# Patient Record
Sex: Male | Born: 1955 | Race: White | Hispanic: No | Marital: Married
Health system: Midwestern US, Academic
[De-identification: ages and names within clinical notes are randomized; demographics above are authoritative.]

---

## 2018-05-13 ENCOUNTER — Encounter: Admit: 2018-05-13 | Discharge: 2018-05-14 | Payer: MEDICARE

## 2018-05-23 ENCOUNTER — Encounter: Admit: 2018-05-23 | Discharge: 2018-05-23 | Payer: MEDICARE

## 2018-05-23 ENCOUNTER — Encounter: Admit: 2018-05-23 | Discharge: 2018-05-24 | Payer: MEDICARE

## 2018-10-10 ENCOUNTER — Encounter: Admit: 2018-10-10 | Discharge: 2018-10-11 | Primary: Family

## 2018-10-28 ENCOUNTER — Encounter: Admit: 2018-10-28 | Discharge: 2018-10-28 | Payer: MEDICARE | Primary: Family

## 2018-10-28 DIAGNOSIS — R69 Illness, unspecified: Secondary | ICD-10-CM

## 2018-10-30 ENCOUNTER — Encounter: Admit: 2018-10-30 | Discharge: 2018-10-30 | Primary: Family

## 2018-10-30 DIAGNOSIS — M545 Low back pain: Secondary | ICD-10-CM

## 2018-11-01 ENCOUNTER — Encounter: Admit: 2018-11-01 | Discharge: 2018-11-01 | Primary: Family

## 2018-11-01 DIAGNOSIS — F329 Major depressive disorder, single episode, unspecified: Secondary | ICD-10-CM

## 2018-11-01 DIAGNOSIS — M810 Age-related osteoporosis without current pathological fracture: Secondary | ICD-10-CM

## 2018-11-01 DIAGNOSIS — E7800 Pure hypercholesterolemia, unspecified: Secondary | ICD-10-CM

## 2018-11-01 DIAGNOSIS — I890 Lymphedema, not elsewhere classified: Secondary | ICD-10-CM

## 2018-11-01 DIAGNOSIS — N529 Male erectile dysfunction, unspecified: Secondary | ICD-10-CM

## 2018-11-07 ENCOUNTER — Encounter: Admit: 2018-11-07 | Discharge: 2018-11-07 | Primary: Family

## 2018-11-07 ENCOUNTER — Ambulatory Visit: Admit: 2018-11-07 | Discharge: 2018-11-07 | Primary: Family

## 2018-11-07 DIAGNOSIS — M810 Age-related osteoporosis without current pathological fracture: Secondary | ICD-10-CM

## 2018-11-07 DIAGNOSIS — I890 Lymphedema, not elsewhere classified: Secondary | ICD-10-CM

## 2018-11-07 DIAGNOSIS — F329 Major depressive disorder, single episode, unspecified: Secondary | ICD-10-CM

## 2018-11-07 DIAGNOSIS — E7800 Pure hypercholesterolemia, unspecified: Secondary | ICD-10-CM

## 2018-11-07 DIAGNOSIS — N529 Male erectile dysfunction, unspecified: Secondary | ICD-10-CM

## 2018-11-07 DIAGNOSIS — M545 Low back pain: Secondary | ICD-10-CM

## 2018-11-08 ENCOUNTER — Encounter: Admit: 2018-11-08 | Discharge: 2018-11-08 | Primary: Family

## 2018-11-08 NOTE — Telephone Encounter
Pt calls to ask questions related to appointment yesterday. Wants to know about treating osteoporosis. Endocrine consult was placed, however pt believes he can get it treated closer to home. Meeting with PCP on Monday. He also is in the process of trying to complete an MRI. He has not been able to related to pain when lying down. He will call when complete and have Dr. Glennon Mac review. Discussed that even if there was problems that could be helped with surgery, his bone density needed to be treated first. He verb understanding. Will call for future questions.

## 2018-11-15 ENCOUNTER — Encounter: Admit: 2018-11-15 | Discharge: 2018-11-15 | Primary: Family

## 2018-11-15 NOTE — Telephone Encounter
Pt called to inform that he is seeking osteoporosis treatment closer to home. He would like records sent to [facility]

## 2018-11-19 ENCOUNTER — Encounter: Admit: 2018-11-19 | Discharge: 2018-11-19 | Primary: Family

## 2018-12-06 ENCOUNTER — Encounter: Admit: 2018-12-06 | Discharge: 2018-12-06 | Payer: MEDICARE | Primary: Family

## 2018-12-06 NOTE — Telephone Encounter
Patient called to inform that he completed MRI in [place] and will have images sent. He started osteoporosis treatment 3 days ago. Will await images and then call pt for f/u appt.

## 2019-01-06 ENCOUNTER — Encounter: Admit: 2019-01-06 | Discharge: 2019-01-06 | Payer: MEDICARE | Primary: Family

## 2019-04-01 ENCOUNTER — Encounter: Admit: 2019-04-01 | Discharge: 2019-04-01 | Payer: MEDICARE | Primary: Family

## 2019-04-01 DIAGNOSIS — M533 Sacrococcygeal disorders, not elsewhere classified: Secondary | ICD-10-CM

## 2019-04-01 DIAGNOSIS — R102 Pelvic and perineal pain: Secondary | ICD-10-CM

## 2019-04-01 NOTE — Telephone Encounter
Patient calls as he is having tailbone and sacral/pelvic pain. Pt has bore bone density. 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 The side effects were so bad he had to stop. He is wanting appointment with Dr. Jean Rosenthal. Discussed that he does not deal with tailbone or sacrum. Suggested call to general orthopedics for appt. Provided names of physicians and phone number.

## 2019-04-14 ENCOUNTER — Encounter: Admit: 2019-04-14 | Discharge: 2019-04-14 | Payer: MEDICARE | Primary: Family

## 2019-04-14 DIAGNOSIS — R102 Pelvic and perineal pain: Secondary | ICD-10-CM

## 2019-04-14 NOTE — Telephone Encounter
New Pt Appt:    Thurs. (04/17/19) at 10AM with Dr. Ronne Binning. Check-in 30 min early. Pt expressed understanding.     Location: [facility], [address], [place], [place]  [address]    Verbal directions to appt given to pt on 2.1.21. Pt declined MyChart Access - no email address. [employer].     Referring: Gardiner Barefoot, APRN    Care Team: Updated on 2.1.21    Dx: acute anal pain, hemorrhoid, constipation    Verbal records hx from pt:    GI Testing:    Approx 3 colonoscopies in past 10 yrs    approx 10+ years ago - EGD and colonoscopy at [facility] [place], [place]    2 colonoscopies by Dr. Nils Flack (GI), [place], [place]. 1 EGD by Dr. Gwendolyn Fill. Approx 2018 recent colonoscopy.    No flex sig hx  No capsule/pill study hx    A/p Surgery:    Jan, 2021 - EUA - anorectal by Dr. Thurmond Butts at [facility], [place], [place]    No other a/p surgery hx    Radiology:    Past 25 yr hx:   [facility], [place], [facility], [place], [place]   [place]    Emailed records from Physician Consult to [facility], RN, and HIM (scan into O2) on 2.1.21.

## 2019-04-15 ENCOUNTER — Ambulatory Visit: Admit: 2019-04-15 | Discharge: 2019-04-15 | Payer: MEDICARE | Primary: Family

## 2019-04-15 ENCOUNTER — Encounter: Admit: 2019-04-15 | Discharge: 2019-04-15 | Payer: MEDICARE | Primary: Family

## 2019-04-15 DIAGNOSIS — R102 Pelvic and perineal pain: Secondary | ICD-10-CM

## 2019-04-15 DIAGNOSIS — M810 Age-related osteoporosis without current pathological fracture: Secondary | ICD-10-CM

## 2019-04-15 DIAGNOSIS — E7800 Pure hypercholesterolemia, unspecified: Secondary | ICD-10-CM

## 2019-04-15 DIAGNOSIS — N529 Male erectile dysfunction, unspecified: Secondary | ICD-10-CM

## 2019-04-15 DIAGNOSIS — I890 Lymphedema, not elsewhere classified: Secondary | ICD-10-CM

## 2019-04-15 DIAGNOSIS — F329 Major depressive disorder, single episode, unspecified: Secondary | ICD-10-CM

## 2019-04-15 NOTE — Progress Notes
Date of Service: 04/15/2019      History of Present Illness  Louis Wood is a 64 y.o. male who is referred today for sacral fractures.  He c/o severe pain around his anus, tailbone, his lumbar spine and hips.  He states he had 2 broken vertebra in his back L1 and L3 and had lower lumbar surgery several years ago.  He went to [facility] clinic and eval for 3 days and told him no more surgeries.   He saw Dr. Jean Rosenthal 3-4 months ago for his spine.  He states he is not able to lay flat.  He was seen in ED 3 weeks ago in [facility] where he was was evaluated with scope by General surgery and told nothing in his GI system that is causing his pain.  The pain is at L1 and extends to his coccyx and greater trochanter.  It goes down his anus and around his testicles.  He had bone density scan that showed bad osteoporosis.  He was found to have abdominal aneurysm when in [facility]. He states the pain was no this bad until he had the shots in his back.        Medical History:   Diagnosis Date   ? Depression    ? Erectile dysfunction    ? High cholesterol    ? Lymphedema    ? Osteoporosis      Surgical History:   Procedure Laterality Date   ? COLONOSCOPY     ? SPINE SURGERY       Family History   Problem Relation Age of Onset   ? Stroke Father    ? Stroke Sister    ? Stroke Brother      Social History     Socioeconomic History   ? Marital status: Married     Spouse name: Not on file   ? Number of children: Not on file   ? Years of education: Not on file   ? Highest education level: Not on file   Occupational History   ? Not on file   Tobacco Use   ? Smoking status: Former Smoker   ? Smokeless tobacco: Never Used   ? Tobacco comment: quit smoking 7 years ago   Substance and Sexual Activity   ? Alcohol use: Never     Frequency: Never   ? Drug use: Yes     Types: Marijuana   ? Sexual activity: Not on file   Other Topics Concern   ? Not on file   Social History Narrative   ? Not on file        Allergies   Allergen Reactions ? Codeine HIVES     it gives me hives  it gives me hives     ? Tamsulosin DIARRHEA     it gives me cramps and diarrhea  it gives me cramps and diarrhea           Review of Systems   Constitutional: Positive for activity change.   Musculoskeletal: Positive for back pain and gait problem.         Objective:         ? calcium carbonate (CALCIUM 500 PO) Take  by mouth twice daily.   ? ergocalciferol (VITAMIN D-2) 1,250 mcg (50,000 unit) capsule Take 1 capsule by mouth every 7 days.   ? methadone (METHADOSE) 10 mg tablet Take 10 mg by mouth twice daily   ? min17/nettle/pumpkin/saw palme (NETTLE-PUMPKIN-SAW PALM-MIN 17  PO) Take  by mouth daily.   ? omeprazole DR (PRILOSEC) 40 mg capsule Take 40 mg by mouth daily.   ? rosuvastatin (CRESTOR) 5 mg tablet Take 5 mg by mouth daily.   ? tamsulosin (FLOMAX) 0.4 mg capsule Take 0.4 mg by mouth daily.     Vitals:    04/15/19 1358   BP: 123/81   BP Source: Arm, Left Upper   Patient Position: Sitting   Pulse: 67   Weight: 72.6 kg (160 lb)   Height: 186.7 cm (73.5)   PainSc: Eight     Body mass index is 20.82 kg/m?Marland Kitchen     Physical Exam  Ortho Exam  General: WDWN male in NAD  HEENT: MMM  CV: RRR  RESP: unlabored  ABD: no gross masses   TTP along his coccyx. TTP along greater trochanter.  His pelvis is stable to compression. Pelvis is stable to external rotation.  2+DPP.   SILT throughout foot. 5/5 EHL, anterior tibial and gastroc. 5/5 quadriceps and hamstrings strength.  5/5 iliopsoas.  No pain or crepitus with internal or external rotation of his hip in his groin.        Radiographs:   xrays of pelvis reviewed today and no fractures seen.        Assessment and Plan:    Low back pain that extends into pelvis specifically the sacrum and coccyx in a 64 y/o male with significant osteoporosis.      I am concerned he has stress fx in pelvis or spine.  He has seen Dr. Jean Rosenthal in past for spine. I recommend ordering a CT spec scan to eval occult fx in pelvis as well as possible occult fx in his hip.       Due to COVID-19 pandemic, I have taken down these notes in presence of Odis Hollingshead, MD using [employer]

## 2019-04-17 ENCOUNTER — Encounter: Admit: 2019-04-17 | Discharge: 2019-04-17 | Payer: MEDICARE | Primary: Family

## 2019-04-17 DIAGNOSIS — K6289 Other specified diseases of anus and rectum: Secondary | ICD-10-CM

## 2019-04-17 DIAGNOSIS — R1030 Lower abdominal pain, unspecified: Secondary | ICD-10-CM

## 2019-04-17 DIAGNOSIS — N4281 Prostatodynia syndrome: Secondary | ICD-10-CM

## 2019-04-17 DIAGNOSIS — I890 Lymphedema, not elsewhere classified: Secondary | ICD-10-CM

## 2019-04-17 DIAGNOSIS — F329 Major depressive disorder, single episode, unspecified: Secondary | ICD-10-CM

## 2019-04-17 DIAGNOSIS — N529 Male erectile dysfunction, unspecified: Secondary | ICD-10-CM

## 2019-04-17 DIAGNOSIS — M810 Age-related osteoporosis without current pathological fracture: Secondary | ICD-10-CM

## 2019-04-17 DIAGNOSIS — E7800 Pure hypercholesterolemia, unspecified: Secondary | ICD-10-CM

## 2019-04-17 NOTE — Progress Notes
Name: Louis Wood          MRN: [id number]      DOB: [birth year]      AGE: 64 y.o.   DATE OF SERVICE: 04/17/2019    Subjective:             Reason for Visit:  New Patient    Chief complain: Rectal and abdominal pain  Louis Wood is a 64 y.o. male.     Cancer Staging  No matching staging information was found for the patient.    Patient is a 64 year old male who presents to the clinic due to rectal and abdominal pain. He states that he has struggled with constipation previously and abdominal pain due to Tylmos injections. He says that in late October/November he was having one bowel movement a week. He stopped using the injections after thanksgiving and began taking laxatives due patient struggling with constipation issues. He says he stopped the laxatives 3 weeks ago and he still struggles with diarrhea two times a day in which they are dark brown and liquid. He states he has rectal pain that is constant and describes it as soreness. He endorses constant throbbing abdominal pain as well. He says that he had an EUA one month ago at [facility]. Joe under anesthesia in which they were unable to explain his rectal pain. His las colonoscopy was in 2019, which he says was normal. He had a previous EGD and was diagnosed with an H. pylori infection and ulcers. He states he has a history of urinary retention with needing a foley catheter and takes Tamsulosin.         Medical History:   Diagnosis Date   ? Depression    ? Erectile dysfunction    ? High cholesterol    ? Lymphedema    ? Osteoporosis      Surgical History:   Procedure Laterality Date   ? COLONOSCOPY     ? SPINE SURGERY       Family History   Problem Relation Age of Onset   ? Stroke Father    ? Stroke Sister    ? Stroke Brother      Social History     Socioeconomic History   ? Marital status: Married     Spouse name: Not on file   ? Number of children: Not on file   ? Years of education: Not on file   ? Highest education level: Not on file Occupational History   ? Not on file   Tobacco Use   ? Smoking status: Former Smoker   ? Smokeless tobacco: Never Used   ? Tobacco comment: quit smoking 7 years ago   Substance and Sexual Activity   ? Alcohol use: Never     Frequency: Never   ? Drug use: Yes     Types: Marijuana   ? Sexual activity: Not on file   Other Topics Concern   ? Not on file   Social History Narrative   ? Not on file              Review of Systems   Constitutional: Positive for activity change, appetite change, fatigue and unexpected weight change.   HENT: Positive for congestion, rhinorrhea, sinus pain and sore throat.    Eyes: Positive for photophobia.   Respiratory: Positive for cough.    Cardiovascular: Negative.    Gastrointestinal: Positive for abdominal pain, anal bleeding, blood in stool, constipation, diarrhea, nausea, rectal  pain and vomiting.   Endocrine: Negative.    Genitourinary: Positive for difficulty urinating.   Musculoskeletal: Positive for back pain.   Skin: Negative.    Allergic/Immunologic: Negative.    Neurological: Positive for headaches.   Hematological: Negative.    Psychiatric/Behavioral: Negative.          Objective:         ? calcium carbonate (CALCIUM 500 PO) Take  by mouth twice daily.   ? ergocalciferol (VITAMIN D-2) 1,250 mcg (50,000 unit) capsule Take 1 capsule by mouth every 7 days.   ? methadone (METHADOSE) 10 mg tablet Take 10 mg by mouth twice daily   ? min17/nettle/pumpkin/saw palme (NETTLE-PUMPKIN-SAW PALM-MIN 17 PO) Take  by mouth daily.   ? omeprazole DR (PRILOSEC) 40 mg capsule Take 40 mg by mouth daily.   ? rosuvastatin (CRESTOR) 5 mg tablet Take 5 mg by mouth daily.   ? tamsulosin (FLOMAX) 0.4 mg capsule Take 0.4 mg by mouth daily.     Vitals:    04/17/19 0951   BP: 132/83   BP Source: Arm, Left Upper   Patient Position: Sitting   Pulse: 78   Resp: 16   Temp: 36.6 ?C (97.8 ?F)   SpO2: 97%   Weight: 71.5 kg (157 lb 9.6 oz)   Height: 186.7 cm (73.5)   PainSc: Seven Body mass index is 20.51 kg/m?Marland Kitchen     Pain Score: Seven  Pain Loc: Rectum    Fatigue Scale: 4    Pain Addressed:  N/A    Patient Evaluated for a Clinical Trial: Patient not eligible for a treatment trial (including not needing treatment, needs palliative care, in remission).     [place] Cooperative Oncology Group performance status is 0, Fully active, able to carry on all pre-disease performance without restriction.Marland Kitchen     Physical Exam  Constitutional:       General: He is not in acute distress.     Appearance: He is not ill-appearing.   HENT:      Head: Normocephalic and atraumatic.   Eyes:      Extraocular Movements: Extraocular movements intact.   Cardiovascular:      Rate and Rhythm: Normal rate and regular rhythm.   Pulmonary:      Effort: Pulmonary effort is normal. No respiratory distress.   Abdominal:      General: There is no distension.      Palpations: Abdomen is soft. There is no mass.      Tenderness: There is no guarding or rebound.      Hernia: No hernia is present.      Comments: TTP of suprapubic region.    Genitourinary:     Comments: External: no pruritis or masses  DRE: no blood or palpable lesions, prostate slightly enlarged and boggy, very ttp   Anoscopy: no blood or lesions, small internal hemorrhoids    Musculoskeletal: Normal range of motion.   Skin:     General: Skin is warm and dry.   Neurological:      General: No focal deficit present.      Mental Status: He is alert and oriented to person, place, and time.               Assessment and Plan:    Patient is a 64 year old male who presents to the clinic with diarrhea, lower abdominal pain and anorectal pain    - Referral for [facility] for workup, r/o prostatitis   - Recommend  high-fiber diet and supplementation BID  - Increase water intake daily to at least 6 cups  - Minimize daily caffeine intake to 1 cup/day   - Patient can RTC PRN     ATTESTATION I personally performed the key portions of the E/M visit, discussed case with resident and concur with resident documentation of history, physical exam, assessment, and treatment plan unless otherwise noted.    Staff name:  Evalina Field, MD Date:  04/17/2019

## 2019-04-17 NOTE — Patient Instructions
DIET  Begin a high fiber diet. The easiest way to achieve this is to add a fiber supplement to your diet. You should take one tablespoon in 8 ounces of water twice a day, ideally 30 minutes before breakfast and dinner. You may experience some gas bloating for the first 2 weeks; this is normal and will go away as long as you keep taking the supplement.   Recommended supplements are Metamucil, Citrucel or Benefiber.   The goal is approximately 6 grams of fiber twice daily.    Also try to Google high fiber foods for more inforomation     Food Category Food Serving Size Total Fiber (grams) Soluble Fiber (grams)   Breads Bagel-whole wheat  Light white/wheat  Pita-whole wheat  Pumpernickel  Whole wheat  Rye 3 1/2 inches  2 slices  7 inches  1 slice  1 slice  1 slice 3  1  4  3  2  2 1   trace  1  1  trace  1   Cereals Bran flakes  Cheerios  Oatmeal  Fiber One  All Bran  Kashi Heart to Heart 3/4 cup  1 1/4 cup  1 cup cooked  1/2 cup  2/3 cup  3/4 cup 5  4  4  14  13  5  trace  1  2  1  1  1    Grains Barley  Brown rice  Pasta-whole wheat  Quinoa  Lentil pasta  Edamame pasta 1/2 cup cooked  1/2 cup   1/2 cup cooked  1/2 cup cooked  1/2 cup cooked 4  2  3  2  17  22 1   trace  1  1  2  3    Legumes and starchy vegetables Garbanzo beans  Kidney beans  Lentils  Potato (with skin)  Potatoes, sweet  Squash (winter)  Green peas, cooked  Lima beans  Corn, cooked 1/2 cup  1/2 cup  1/2 cup  1 medium  1/2 cup  1/2 cup  1/2 cup  1/2 cup  1/2 cup 4  6  5  3  4  3  4  7  2 1  3  1  1  2  2  1  3   trace   Nuts and Seeds Almonds  Peanuts  Sunflower seeds  Walnuts  Flaxseed(ground)  Chia Seeds  Hemp Seeds 1/4 cup  1/4 cup  1/4 cup  1/4 cup  1/8 c or 2tbsp  1/8 c or 2tbsp  1/8 c or 2tbsp 3  3  3  2  4  10  2 1  1  1   trace  2  7  1    Fruits Apple with skin  Banana  Blueberries  Grapefruit  Orange  Pear with skin  Prunes  Strawberries 1 medium  1 medium  1 cup  1/2 cup  1 medium  1 medium  3  1 cup 3  2  2  1  3  4  2  4 1  1   trace  1  2  2 1  1    Vegetables, non-starchy Broccoli  Brussels sprouts  Cabbage-green  Carrot  Cauliflower 1/2 cup  1/2 cup  1 cup, fresh  1/2 cup, cooked  1/2 cup, cooked 3  4  2  2  1 1  2  1  1   1

## 2019-04-21 ENCOUNTER — Ambulatory Visit: Admit: 2019-04-21 | Discharge: 2019-04-21 | Payer: MEDICARE | Primary: Family

## 2019-04-21 ENCOUNTER — Encounter: Admit: 2019-04-21 | Discharge: 2019-04-21 | Payer: MEDICARE | Primary: Family

## 2019-04-21 DIAGNOSIS — R102 Pelvic and perineal pain: Secondary | ICD-10-CM

## 2019-04-21 MED ORDER — RP DX TC-99M MEDRONATE MCI
25 | Freq: Once | INTRAVENOUS | 0 refills | Status: CP
Start: 2019-04-21 — End: ?
  Administered 2019-04-21: 15:00:00 25.5 via INTRAVENOUS

## 2019-04-23 ENCOUNTER — Encounter: Admit: 2019-04-23 | Discharge: 2019-04-23 | Payer: MEDICARE | Primary: Family

## 2019-04-23 NOTE — Telephone Encounter
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 Cherene Julian does not think is causing pt pain issues. Pt happy to hear there are no injuries in his pelvis/sacrum. Recommended he follow back up w/ spine center as Dr. Cherene Julian believes his spine is the source of his pain. Pt states understanding.

## 2019-05-09 ENCOUNTER — Encounter: Admit: 2019-05-09 | Discharge: 2019-05-09 | Payer: MEDICARE | Primary: Family

## 2019-05-09 ENCOUNTER — Ambulatory Visit: Admit: 2019-05-09 | Discharge: 2019-05-10 | Payer: MEDICARE | Primary: Family

## 2019-05-09 DIAGNOSIS — N529 Male erectile dysfunction, unspecified: Secondary | ICD-10-CM

## 2019-05-09 DIAGNOSIS — R300 Dysuria: Secondary | ICD-10-CM

## 2019-05-09 DIAGNOSIS — M810 Age-related osteoporosis without current pathological fracture: Secondary | ICD-10-CM

## 2019-05-09 DIAGNOSIS — E7800 Pure hypercholesterolemia, unspecified: Secondary | ICD-10-CM

## 2019-05-09 DIAGNOSIS — F329 Major depressive disorder, single episode, unspecified: Secondary | ICD-10-CM

## 2019-05-09 DIAGNOSIS — N401 Enlarged prostate with lower urinary tract symptoms: Secondary | ICD-10-CM

## 2019-05-09 DIAGNOSIS — I890 Lymphedema, not elsewhere classified: Secondary | ICD-10-CM

## 2019-05-09 LAB — URINALYSIS DIPSTICK REFLEX TO CULTURE
Lab: 1 (ref 1.003–1.035)
Lab: 7 (ref 5.0–8.0)
Lab: NEGATIVE
Lab: NEGATIVE
Lab: NEGATIVE
Lab: NEGATIVE
Lab: NEGATIVE
Lab: NEGATIVE
Lab: NEGATIVE
Lab: NEGATIVE

## 2019-05-09 LAB — URINALYSIS MICROSCOPIC REFLEX TO CULTURE

## 2019-05-09 MED ORDER — TAMSULOSIN 0.4 MG PO CAP
.4 mg | ORAL_CAPSULE | Freq: Two times a day (BID) | ORAL | 3 refills | 90.00000 days | Status: AC
Start: 2019-05-09 — End: ?

## 2019-05-10 ENCOUNTER — Encounter: Admit: 2019-05-10 | Discharge: 2019-05-10 | Payer: MEDICARE | Primary: Family

## 2019-05-10 DIAGNOSIS — N529 Male erectile dysfunction, unspecified: Secondary | ICD-10-CM

## 2019-05-10 DIAGNOSIS — E785 Hyperlipidemia, unspecified: Secondary | ICD-10-CM

## 2019-05-10 DIAGNOSIS — N401 Enlarged prostate with lower urinary tract symptoms: Secondary | ICD-10-CM

## 2019-05-10 DIAGNOSIS — N4281 Prostatodynia syndrome: Secondary | ICD-10-CM

## 2019-05-10 DIAGNOSIS — F329 Major depressive disorder, single episode, unspecified: Secondary | ICD-10-CM

## 2019-05-10 DIAGNOSIS — I890 Lymphedema, not elsewhere classified: Secondary | ICD-10-CM

## 2019-05-10 DIAGNOSIS — N138 Other obstructive and reflux uropathy: Secondary | ICD-10-CM

## 2019-05-10 DIAGNOSIS — M810 Age-related osteoporosis without current pathological fracture: Secondary | ICD-10-CM

## 2019-05-30 ENCOUNTER — Encounter: Admit: 2019-05-30 | Discharge: 2019-05-30 | Payer: MEDICARE | Primary: Family

## 2019-05-30 NOTE — Telephone Encounter
Patient called stating he had a medication question. Patient stated he has been having dry ejaculations since he started the flomax. Informed him this is normal. Stated he doesn't like the side effect. Let him know that it is up to him if he would like to stoop taking it if the side effects are that bothersome. He stated no, it does help him urinate so he wants to keep taking it. He had no further questions at this time.

## 2020-08-17 ENCOUNTER — Encounter: Admit: 2020-08-17 | Discharge: 2020-08-17 | Payer: MEDICARE | Primary: Family

## 2020-08-17 NOTE — Telephone Encounter
06/07 - STAT records request faxed to PCP - Gardiner Barefoot, ARNP - [phone]; fax - [phone]. We only received most recent visit notes with referral.     06/07 - Referral with visit notes are saved to consolidated folder until appointment scheduled. / JMR

## 2020-08-20 ENCOUNTER — Encounter: Admit: 2020-08-20 | Discharge: 2020-08-20 | Payer: MEDICARE | Primary: Family

## 2020-08-20 NOTE — Telephone Encounter
08/20/20 - Records have been requested per work que task / sjg  _______________________________    Please request additional records from PCP, Gardiner Barefoot, APRN - Ph: [phone]; Fax: [phone]     and EKG from [facility] - Ph: [phone]. (F) [phone]

## 2020-08-20 NOTE — Telephone Encounter
08-20-20-Received and EKG from [facility] and records from PCP Gardiner Barefoot, APRN and they have been indexed into the chart. sls

## 2020-08-23 ENCOUNTER — Encounter: Admit: 2020-08-23 | Discharge: 2020-08-23 | Payer: MEDICARE | Primary: Family

## 2020-08-23 NOTE — Progress Notes
Records Request-STAT    Medical records request for continuation of care:    Patient has appointment  with  Dr. Arna Medici .    Please fax records to Cardiovascular Medicine [place] [facility] [phone]    Request records:  Louis Wood  [birth year]    MOST RECENT:    ABDOMINAL US  CHEST CT    Thank you,      Cardiovascular Medicine  [facility]  [address]  [place], [place] [address]  Phone:  [phone]  Fax:  [phone]

## 2020-08-24 ENCOUNTER — Encounter: Admit: 2020-08-24 | Discharge: 2020-08-24 | Payer: MEDICARE | Primary: Family

## 2020-08-26 ENCOUNTER — Encounter: Admit: 2020-08-26 | Discharge: 2020-08-26 | Payer: MEDICARE | Primary: Family

## 2020-08-26 DIAGNOSIS — M810 Age-related osteoporosis without current pathological fracture: Secondary | ICD-10-CM

## 2020-08-26 DIAGNOSIS — E785 Hyperlipidemia, unspecified: Secondary | ICD-10-CM

## 2020-08-26 DIAGNOSIS — M545 Chronic low back pain: Secondary | ICD-10-CM

## 2020-08-26 DIAGNOSIS — R002 Palpitations: Secondary | ICD-10-CM

## 2020-08-26 DIAGNOSIS — I890 Lymphedema, not elsewhere classified: Secondary | ICD-10-CM

## 2020-08-26 DIAGNOSIS — N401 Enlarged prostate with lower urinary tract symptoms: Secondary | ICD-10-CM

## 2020-08-26 DIAGNOSIS — F32A Depression: Secondary | ICD-10-CM

## 2020-08-26 DIAGNOSIS — N529 Male erectile dysfunction, unspecified: Secondary | ICD-10-CM

## 2020-08-26 DIAGNOSIS — I714 Abdominal aortic aneurysm, without rupture: Secondary | ICD-10-CM

## 2020-08-26 DIAGNOSIS — R0600 Dyspnea, unspecified: Secondary | ICD-10-CM

## 2020-08-26 NOTE — Progress Notes
Date of Service: 08/26/2020    Louis Wood is a 65 y.o. male.       HPI     Mr. Louis Wood was referred to clinic today for further evaluation of an abnormal ECG which showed premature ectopic beats.  He reports that he has also been followed for an abdominal aortic aneurysm which is not yet required treatment.  Repeat abdominal ultrasound has been ordered by his primary care physician and is scheduled for next week according to the patient.  His greatest problem is chronic back pain and he indicates that he has undergone a number of back surgeries in the past.  He reports that he has been disabled since 1992 before that worked in a [employer].  He has been treated with low-dose rosuvastatin but does not want to increase his dose.  He has not been treated for hypertension or diabetes mellitus.  He has a prior smoking history but quit smoking in 2011.  Otherwise, The patient has been stable and reports no angina, congestive symptoms, palpitations, sensation of sustained forceful heart pounding, lightheadedness or syncope.  His exercise tolerance has been stable, but is limited to walking approximately 100 yards because of chronic back discomfort.  Occasionally, he may experience some dyspnea with more prolonged walking.  He has no other congestive symptoms such as edema, orthopnea or nocturnal dyspnea.. The patient reports no myalgias, bleeding abnormalities, or strokelike symptoms.       Vitals:    08/26/20 1305   BP: 122/78   BP Source: Arm, Left Upper   Pulse: 103   SpO2: 97%   O2 Device: None (Room air)   PainSc: Zero   Weight: 73 kg (161 lb)   Height: 188 cm (6' 2)     Body mass index is 20.67 kg/m?Marland Kitchen     Past Medical History  Patient Active Problem List    Diagnosis Date Noted   ? 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 Hyperlipidemia 08/26/2020   ? AAA (abdominal aortic aneurysm) (HCC) 08/26/2020   ? BPH with obstruction/lower urinary tract symptoms    ? 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   HENT: Negative.    Eyes: Negative.    Cardiovascular: Negative.    Respiratory: Negative.    Endocrine: Negative.    Hematologic/Lymphatic: Negative.    Skin: Negative.    Musculoskeletal: Negative.    Gastrointestinal: Negative.    Genitourinary: Negative.    Neurological: Negative.    Psychiatric/Behavioral: Negative.    Allergic/Immunologic: Negative.        Physical Exam  GENERAL: The patient is well developed, well nourished, resting comfortably and in no distress.   HEENT: No abnormalities of the visible oro-nasopharynx, conjunctiva or sclera are noted.  NECK: There is no jugular venous distension. Carotids are palpable and without bruits. There is no thyroid enlargement.  Chest: Lung fields are clear to auscultation. There are no wheezes or crackles.  He is wearing a back brace which she indicates helps quite a bit.  CV: There is a regular rhythm. The first and second heart sounds are normal. There are no murmurs, gallops or rubs.  ABD: The abdomen is soft and supple with normal bowel sounds. There is no hepatosplenomegaly, ascites, tenderness, masses or bruits.  Neuro: There are no focal motor defects. Ambulation is normal. Cognitive function appears normal.  Ext: There is no edema or evidence of deep  vein thrombosis. Peripheral pulses are satisfactory.    SKIN: There are no rashes and no cellulitis  PSYCH: The patient is calm, rationale and oriented.    Cardiovascular Studies  A twelve-lead ECG was obtained on 08/26/2020 reveals normal sinus rhythm with multiple premature atrial beats.  The heart rate is 71 bpm and there is no evidence of myocardial ischemia or infarction.    Cardiovascular Health Factors  Vitals BP Readings from Last 3 Encounters:   08/26/20 122/78   05/09/19 (!) 138/93   04/17/19 132/83     Wt Readings from Last 3 Encounters:   08/26/20 73 kg (161 lb)   05/09/19 72.1 kg (159 lb)   04/17/19 71.5 kg (157 lb 9.6 oz)     BMI Readings from Last 3 Encounters:   08/26/20 20.67 kg/m?   05/09/19 20.69 kg/m?   04/17/19 20.51 kg/m?      Smoking Social History     Tobacco Use   Smoking Status Former Smoker   ? Types: Cigarettes   ? Quit date: 08/26/2009   ? Years since quitting: 11.0   Smokeless Tobacco Never Used   Tobacco Comment    quit smoking 7 years ago      Lipid Profile Cholesterol   Date Value Ref Range Status   02/17/2020 142  Final     HDL   Date Value Ref Range Status   02/17/2020 43  Final     LDL   Date Value Ref Range Status   02/17/2020 86  Final     Triglycerides   Date Value Ref Range Status   02/17/2020 67  Final      Blood Sugar No results found for: HGBA1C  Glucose   Date Value Ref Range Status   02/17/2020 88  Final          Problems Addressed Today  No diagnosis found.    Assessment and Plan     Mr. Socarras is very polite but does not want to increase his dose of rosuvastatin.  He does not want any additional monitoring for the premature atrial beats noted on his ECG.  He is willing to obtain an echo Doppler study to assess for any structural cardiac abnormalities I have asked him to send me the results of his abdominal sonogram for review.  If he has a significant abdominal aneurysm, he wants to be referred to vascular surgery at [facility] hospital.  I have asked him to return for follow-up in approximately 12 months time. The total time spent during this interview and exam was 40 minutes.         Current Medications (including today's revisions)  ? calcium carbonate (CALCIUM 500 PO) Take  by mouth twice daily.   ? ergocalciferol (VITAMIN D-2) 1,250 mcg (50,000 unit) capsule Take 1 capsule by mouth every 7 days.   ? methadone (METHADOSE) 10 mg tablet Take 10 mg by mouth every 8 hours.   ? omeprazole DR (PRILOSEC) 40 mg capsule Take 40 mg by mouth daily.   ? other medication 1 Dose twice daily. Super Beta Prostate   ? pantoprazole DR (PROTONIX) 20 mg tablet Take 20 mg by mouth daily.   ? rosuvastatin (CRESTOR) 5 mg tablet Take 5 mg by mouth daily.   ? tamsulosin (FLOMAX) 0.4 mg capsule Take one capsule by mouth twice daily after meals.

## 2020-08-26 NOTE — Progress Notes
Records Request-STAT    Medical records request for continuation of care:    Patient has appointment THIS MORNING   with  Dr. Arna Medici .    Please fax records to Cardiovascular Medicine [place] [facility] [phone]    Request records:        H&P/Discharge Summary (Hospitalization Mar 21 2019-ALL records from visit)        Thank you,      Cardiovascular Medicine  [facility]  [address]  [place], [place] [address]  Phone:  [phone]  Fax:  [phone]

## 2020-08-26 NOTE — Patient Instructions
Echo    Follow up as directed.  Call sooner if issues.  Call the [facility] nursing line at [phone].  Leave a detailed message for the nurse in [facility] with how we can assist you and we will call you back.

## 2020-09-01 ENCOUNTER — Encounter: Admit: 2020-09-01 | Discharge: 2020-09-01 | Payer: MEDICARE | Primary: Family

## 2020-09-01 ENCOUNTER — Ambulatory Visit: Admit: 2020-09-01 | Discharge: 2020-09-01 | Payer: MEDICARE | Primary: Family

## 2020-09-01 DIAGNOSIS — R0600 Dyspnea, unspecified: Secondary | ICD-10-CM

## 2020-09-01 DIAGNOSIS — I714 Abdominal aortic aneurysm, without rupture: Secondary | ICD-10-CM

## 2020-09-01 DIAGNOSIS — R002 Palpitations: Secondary | ICD-10-CM

## 2020-09-08 ENCOUNTER — Encounter: Admit: 2020-09-08 | Discharge: 2020-09-08 | Payer: MEDICARE | Primary: Family

## 2020-09-08 NOTE — Telephone Encounter
-----   Message from Hester Mates, MD sent at 09/02/2020  4:08 PM CDT -----  To all: Echo Doppler study looks favorable.  He has mild mitral valve regurgitation which can be followed over time.  Thanks.  SBG  ----- Message -----  From: Altamease Oiler, MD  Sent: 09/02/2020   7:32 AM CDT  To: Hester Mates, MD

## 2020-09-08 NOTE — Telephone Encounter
Results and recommendations called to patient.

## 2021-03-15 ENCOUNTER — Encounter: Admit: 2021-03-15 | Discharge: 2021-03-15 | Payer: MEDICARE | Primary: Family

## 2021-03-15 DIAGNOSIS — E785 Hyperlipidemia, unspecified: Secondary | ICD-10-CM

## 2021-03-15 DIAGNOSIS — I714 Abdominal aortic aneurysm (AAA) without rupture, unspecified part: Secondary | ICD-10-CM

## 2021-03-15 NOTE — Telephone Encounter
Received a call from patient stating that he had an abdominal US completed at [facility] ordered by his PCP. Patient states that PCP office called and told him to follow up with Dr. Arna Medici regarding findings.         Reviewed both Abdominal US dated 03/14/21 and 08/30/20 with Dr. Arna Medici. He states that based off these reports he would recommend patient be referred to Dr. Hollie Beach down at [facility] for further diagnostic studies.       Called patient to discuss Dr. Wesley Blas recommendations. Patient is agreeable to care plan and has no further questions at this time.

## 2021-03-24 ENCOUNTER — Encounter: Admit: 2021-03-24 | Discharge: 2021-03-24 | Payer: MEDICARE | Primary: Family

## 2021-03-24 NOTE — Progress Notes
Records Request:  Louis Wood  [birth year]      Medical records request for continuation of care:    Please fax records to Cardiovascular Medicine [facility] [phone]    Request records:      Procedures - Please cloud images of abdominal ultrasounds from January 2023 and June 2022.    Thank you,      Cardiovascular Medicine  [facility]  [address]  [place], [place] [address]  Phone:  [phone]  Fax:  [phone]

## 2021-03-25 ENCOUNTER — Encounter: Admit: 2021-03-25 | Discharge: 2021-03-25 | Payer: MEDICARE | Primary: Family

## 2021-04-10 ENCOUNTER — Emergency Department: Admit: 2021-04-10 | Discharge: 2021-04-11 | Payer: MEDICARE

## 2021-04-10 ENCOUNTER — Emergency Department: Admit: 2021-04-10 | Discharge: 2021-04-10 | Payer: MEDICARE

## 2021-04-10 ENCOUNTER — Encounter: Admit: 2021-04-10 | Discharge: 2021-04-10 | Payer: MEDICARE | Primary: Family

## 2021-04-10 DIAGNOSIS — N401 Enlarged prostate with lower urinary tract symptoms: Secondary | ICD-10-CM

## 2021-04-10 DIAGNOSIS — R1032 Left lower quadrant pain: Secondary | ICD-10-CM

## 2021-04-10 DIAGNOSIS — E785 Hyperlipidemia, unspecified: Secondary | ICD-10-CM

## 2021-04-10 DIAGNOSIS — M545 Chronic low back pain: Secondary | ICD-10-CM

## 2021-04-10 DIAGNOSIS — N529 Male erectile dysfunction, unspecified: Secondary | ICD-10-CM

## 2021-04-10 DIAGNOSIS — M810 Age-related osteoporosis without current pathological fracture: Secondary | ICD-10-CM

## 2021-04-10 DIAGNOSIS — I890 Lymphedema, not elsewhere classified: Secondary | ICD-10-CM

## 2021-04-10 DIAGNOSIS — F32A Depression: Secondary | ICD-10-CM

## 2021-04-10 LAB — URINALYSIS DIPSTICK REFLEX TO CULTURE
GLUCOSE,UA: NEGATIVE
LEUKOCYTES: NEGATIVE
NITRITE: NEGATIVE
PROTEIN,UA: NEGATIVE
URINE ASCORBIC ACID, UA: NEGATIVE
URINE BILE: NEGATIVE
URINE BLOOD: NEGATIVE
URINE KETONE: NEGATIVE
URINE PH: 7 (ref 5.0–8.0)
URINE SPEC GRAVITY: 1 /HPF — ABNORMAL HIGH (ref 1.005–1.030)

## 2021-04-10 LAB — CBC AND DIFF
ABSOLUTE BASO COUNT: 0 K/UL (ref 0–0.20)
ABSOLUTE EOS COUNT: 0 K/UL (ref 0–0.45)
ABSOLUTE NEUTROPHIL: 6.7 K/UL (ref 1.8–7.0)
BASOPHILS %: 1 % (ref 0–2)
MDW (MONOCYTE DISTRIBUTION WIDTH): 16 (ref <20.7)
MONOCYTES %: 7 % (ref 4–12)
RBC COUNT: 4.9 M/UL — ABNORMAL LOW (ref >60)
WBC COUNT: 9 K/UL (ref 4.5–11.0)

## 2021-04-10 LAB — COMPREHENSIVE METABOLIC PANEL
ALBUMIN: 4.1 g/dL (ref 3.5–5.0)
ALK PHOSPHATASE: 53 U/L — ABNORMAL LOW (ref 25–110)
BLD UREA NITROGEN: 12 mg/dL (ref <20.7)
CALCIUM: 9.3 mg/dL (ref 8.5–10.6)
CHLORIDE: 102 MMOL/L — ABNORMAL LOW (ref 98–110)
CO2: 23 MMOL/L (ref 21–30)
CREATININE: 0.9 mg/dL (ref 0.4–1.24)
EGFR: >60 mL/min (ref >60)
POTASSIUM: 3.9 MMOL/L (ref 3.5–5.1)
TOTAL BILIRUBIN: 0.5 mg/dL (ref 0.3–1.2)
TOTAL PROTEIN: 6.7 g/dL (ref 6.0–8.0)

## 2021-04-10 LAB — POC CREATININE, RAD: CREATININE, POC: 0.9 mg/dL (ref 0.4–1.24)

## 2021-04-10 LAB — POC LACTATE: LACTIC ACID POC: 0.9 MMOL/L (ref 0.5–2.0)

## 2021-04-10 MED ORDER — KETOROLAC 15 MG/ML IJ SOLN
15 mg | Freq: Once | INTRAVENOUS | 0 refills | Status: CP
Start: 2021-04-10 — End: ?
  Administered 2021-04-11: 02:00:00 15 mg via INTRAVENOUS

## 2021-04-10 MED ORDER — IOHEXOL 350 MG IODINE/ML IV SOLN
100 mL | Freq: Once | INTRAVENOUS | 0 refills | Status: CP
Start: 2021-04-10 — End: ?
  Administered 2021-04-11: 100 mL via INTRAVENOUS

## 2021-04-10 MED ORDER — SODIUM CHLORIDE 0.9 % IJ SOLN
50 mL | Freq: Once | INTRAVENOUS | 0 refills | Status: CP
Start: 2021-04-10 — End: ?
  Administered 2021-04-11: 50 mL via INTRAVENOUS

## 2021-04-10 MED ORDER — FENTANYL CITRATE (PF) 50 MCG/ML IJ SOLN
50 ug | Freq: Once | INTRAVENOUS | 0 refills | Status: CP
Start: 2021-04-10 — End: ?
  Administered 2021-04-10: 50 ug via INTRAVENOUS

## 2021-04-10 MED ORDER — ONDANSETRON HCL (PF) 4 MG/2 ML IJ SOLN
4 mg | Freq: Once | INTRAVENOUS | 0 refills | Status: CP
Start: 2021-04-10 — End: ?
  Administered 2021-04-10: 4 mg via INTRAVENOUS

## 2021-04-11 NOTE — ED Notes
Discussed discharge paperwork and follow up instructions with pt. Pt v/u and is agreeable to plan of care. PIV removed, catheter tip intact. Pt denies further questions. Pt ambulated off unit with steady gait, A&Ox4, breathing even and unlabored. All paperwork and belongings in pt possession at time of departure.

## 2021-04-11 NOTE — Unmapped
Please return to the ER if you develop worsening pain, fever or passing out prior to you appointment on Tuesday.

## 2021-04-12 ENCOUNTER — Ambulatory Visit: Admit: 2021-04-12 | Discharge: 2021-04-12 | Payer: MEDICARE | Primary: Family

## 2021-04-12 ENCOUNTER — Encounter: Admit: 2021-04-12 | Discharge: 2021-04-12 | Payer: MEDICARE | Primary: Family

## 2021-04-12 DIAGNOSIS — E785 Hyperlipidemia, unspecified: Secondary | ICD-10-CM

## 2021-04-12 DIAGNOSIS — I7132 Juxtarenal abdominal aortic aneurysm, ruptured: Secondary | ICD-10-CM

## 2021-04-12 DIAGNOSIS — M545 Chronic low back pain: Secondary | ICD-10-CM

## 2021-04-12 DIAGNOSIS — I714 Abdominal aortic aneurysm, without rupture, unspecified: Secondary | ICD-10-CM

## 2021-04-12 DIAGNOSIS — N529 Male erectile dysfunction, unspecified: Secondary | ICD-10-CM

## 2021-04-12 DIAGNOSIS — I890 Lymphedema, not elsewhere classified: Secondary | ICD-10-CM

## 2021-04-12 DIAGNOSIS — N401 Enlarged prostate with lower urinary tract symptoms: Secondary | ICD-10-CM

## 2021-04-12 DIAGNOSIS — M810 Age-related osteoporosis without current pathological fracture: Secondary | ICD-10-CM

## 2021-04-12 DIAGNOSIS — F32A Depression: Secondary | ICD-10-CM

## 2021-04-12 MED ORDER — ASPIRIN 81 MG PO TBEC
81 mg | ORAL_TABLET | Freq: Every day | ORAL | 3 refills | Status: AC
Start: 2021-04-12 — End: ?

## 2021-04-12 NOTE — Progress Notes
Date of Service: 04/12/2021              Chief Complaint   Patient presents with   ? Consult     AAA       History of Present Illness    The patient is a pleasant 66 year old gentleman who presents today for follow-up of a abdominal aortic aneurysm and common iliac artery aneurysms.  He recently presented to the emergency department a couple days ago for evaluation of worsening abdominal pain.  A CTA of the abdomen pelvis was obtained to rule out any aortic problems.  He is accompanied today by his daughter.  He states that the pain is located focally at his left lower quadrant.  He points to this area.  He states that this has been present for a month.  It is slightly improved since a couple days ago however over the past week or so it had worsened and thus prompted his visit to the emergency department.  He states a few years ago he had some bowel, bladder and abdominal complaints which were all related to a medication.  Some difficulties with incontinence and urinary troubles in the past.  He states that this is not similar to that episode.  I believe per chart review that he had some issues with prostatitis and had even seen a colorectal surgeon and urologist regarding all this.  In any case he states that he has some chronic constipation and occasionally will take MiraLAX.  He has not taken this regularly and he has not taken it today.  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  He denies any family history of aneurysms or sudden deaths or rupture of aneurysms in the past.  He states that he did smoke a few years ago however has not recently.  He denies any recent fevers or chills.  He states that he has a primary care physician and [facility] I believe but has not seen them recently.  He states that it has been a while since he has had a colonoscopy.    Medical History:   Diagnosis Date   ? BPH with obstruction/lower urinary tract symptoms    ? 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 Depression    ? Dyslipidemia    ? Erectile dysfunction    ? Hyperlipidemia 08/26/2020   ? Lymphedema    ? Osteoporosis        Surgical History:   Procedure Laterality Date   ? COLONOSCOPY     ? SPINE SURGERY         Allergies:  Allergies   Allergen Reactions   ? Codeine HIVES     it gives me hives  it gives me hives         Medication List:  ? methadone (METHADOSE) 10 mg tablet Take 10 mg by mouth every 8 hours.   ? other medication 1 Dose twice daily. Super Beta Prostate   ? pantoprazole DR (PROTONIX) 20 mg tablet Take 20 mg by mouth daily.   ? rosuvastatin (CRESTOR) 5 mg tablet Take 5 mg by mouth daily.       Social History:   reports that he quit smoking about 11 years ago. His smoking use included cigarettes. He has never used smokeless tobacco. He reports that he does not currently use alcohol. He reports current drug use. Drug: Marijuana.    Family History   Problem Relation Age  of Onset   ? Stroke Father    ? Cancer Father    ? Stroke Sister    ? Stroke Brother    ? Cancer Mother        Review of Systems   Constitutional: Negative.    HENT: Negative.    Eyes: Negative.    Respiratory: Negative.    Cardiovascular: Negative.    Gastrointestinal: Positive for abdominal pain and nausea.   Endocrine: Negative.    Genitourinary: Negative.    Musculoskeletal: Negative.    Skin: Negative.    Allergic/Immunologic: Negative.    Neurological: Negative.    Hematological: Negative.    Psychiatric/Behavioral: Negative.                Vitals:    04/12/21 1345 04/12/21 1346   BP: 134/87 129/89   BP Source: Arm, Left Upper Arm, Right Upper   Pulse: 105 112   Temp: 36.6 ?C (97.9 ?F)    Resp: 20    TempSrc: Oral    PainSc: Zero    Weight: 72.7 kg (160 lb 3.2 oz)    Height: 188 cm (6' 2)      Body mass index is 20.57 kg/m?Marland Kitchen     Physical Exam  HENT:      Head: Normocephalic.      Nose: Nose normal.      Mouth/Throat:      Mouth: Mucous membranes are moist.   Eyes:      Pupils: Pupils are equal, round, and reactive to light.   Cardiovascular:      Rate and Rhythm: Normal rate.      Pulses: Normal pulses.   Pulmonary:      Effort: Pulmonary effort is normal.   Abdominal:      General: Abdomen is flat. There is no distension.      Palpations: Abdomen is soft.      Tenderness: There is no abdominal tenderness.      Hernia: A hernia is present.   Musculoskeletal:         General: Normal range of motion.      Cervical back: Normal range of motion.   Skin:     General: Skin is warm and dry.      Capillary Refill: Capillary refill takes less than 2 seconds.   Neurological:      Mental Status: He is alert.             Assessment and Plan:    1. Juxtarenal ruptured abdominal aortic aneurysm (AAA)  VAS US DOPPLER ABD PELV RETROPER LTD        The patient is a 66 year old male who has a small abdominal aortic aneurysm which is ~3.3cm and bilateral common iliac artery aneurysms (right 3.0cm an left 2.5cm). I do not suspect that these are the etiology of his abdominal pain. I discussed the natural history of aneurysms, pathophysiology, management and treatment with him. I would plan on aorto-iliac duplex in 3 years for follow-up of these. Regarding his abdominal pain this is suspecious for colitis, gastroenteritis or more likely diverticulitis. He has point tenderness which is new. I do recommend an outpatient evaluation of this and perhaps his primary care can help arrange this. His CT scan does not show much inflammation around his sigmoid colon however. Perhaps he would benefit from a colonoscopy. In any case again I do not believe this is in relation to his aneurysms.

## 2021-04-27 ENCOUNTER — Encounter: Admit: 2021-04-27 | Discharge: 2021-04-27 | Payer: MEDICARE | Primary: Family

## 2021-04-27 ENCOUNTER — Emergency Department: Admit: 2021-04-27 | Discharge: 2021-04-27 | Payer: MEDICARE

## 2021-04-27 ENCOUNTER — Emergency Department: Admit: 2021-04-27 | Discharge: 2021-04-28 | Payer: MEDICARE

## 2021-04-27 DIAGNOSIS — M810 Age-related osteoporosis without current pathological fracture: Secondary | ICD-10-CM

## 2021-04-27 DIAGNOSIS — N529 Male erectile dysfunction, unspecified: Secondary | ICD-10-CM

## 2021-04-27 DIAGNOSIS — M545 Chronic low back pain: Secondary | ICD-10-CM

## 2021-04-27 DIAGNOSIS — F32A Depression: Secondary | ICD-10-CM

## 2021-04-27 DIAGNOSIS — R1032 Left lower quadrant pain: Secondary | ICD-10-CM

## 2021-04-27 DIAGNOSIS — E785 Hyperlipidemia, unspecified: Secondary | ICD-10-CM

## 2021-04-27 DIAGNOSIS — N401 Enlarged prostate with lower urinary tract symptoms: Secondary | ICD-10-CM

## 2021-04-27 DIAGNOSIS — I890 Lymphedema, not elsewhere classified: Secondary | ICD-10-CM

## 2021-04-27 LAB — CBC AND DIFF
ABSOLUTE BASO COUNT: 0 K/UL (ref 0–0.20)
MDW (MONOCYTE DISTRIBUTION WIDTH): 16 (ref <20.7)
WBC COUNT: 6.8 K/UL (ref 4.5–11.0)

## 2021-04-27 LAB — URINALYSIS MICROSCOPIC REFLEX TO CULTURE

## 2021-04-27 LAB — COMPREHENSIVE METABOLIC PANEL
CHLORIDE: 99 MMOL/L (ref 98–110)
SODIUM: 137 MMOL/L (ref 137–147)

## 2021-04-27 LAB — URINALYSIS DIPSTICK REFLEX TO CULTURE
GLUCOSE,UA: NEGATIVE mg/dL (ref 0.3–1.2)
NITRITE: NEGATIVE U/L (ref 7–56)
URINE BILE: NEGATIVE U/L — ABNORMAL LOW (ref 25–110)
URINE KETONE: NEGATIVE g/dL (ref 3.5–5.0)

## 2021-04-27 LAB — POC CREATININE, RAD: CREATININE, POC: 0.9 mg/dL (ref 0.4–1.24)

## 2021-04-27 LAB — LIPASE: LIPASE: 11 U/L (ref 11–82)

## 2021-04-27 MED ORDER — SODIUM CHLORIDE 0.9 % IJ SOLN
50 mL | Freq: Once | INTRAVENOUS | 0 refills | Status: CP
Start: 2021-04-27 — End: ?
  Administered 2021-04-28: 01:00:00 50 mL via INTRAVENOUS

## 2021-04-27 MED ORDER — FENTANYL CITRATE (PF) 50 MCG/ML IJ SOLN
50 ug | Freq: Once | INTRAVENOUS | 0 refills | Status: CP
Start: 2021-04-27 — End: ?
  Administered 2021-04-28: 01:00:00 50 ug via INTRAVENOUS

## 2021-04-27 MED ORDER — HYDROCODONE-ACETAMINOPHEN 5-325 MG PO TAB
1 | Freq: Once | ORAL | 0 refills | Status: CP
Start: 2021-04-27 — End: ?
  Administered 2021-04-28: 03:00:00 1 via ORAL

## 2021-04-27 MED ORDER — METHOCARBAMOL 750 MG PO TAB
750 mg | ORAL_TABLET | Freq: Three times a day (TID) | ORAL | 0 refills | Status: AC | PRN
Start: 2021-04-27 — End: ?

## 2021-04-27 MED ORDER — IOHEXOL 350 MG IODINE/ML IV SOLN
100 mL | Freq: Once | INTRAVENOUS | 0 refills | Status: CP
Start: 2021-04-27 — End: ?
  Administered 2021-04-28: 01:00:00 100 mL via INTRAVENOUS

## 2021-04-27 MED ORDER — METHOCARBAMOL 750 MG PO TAB
750 mg | Freq: Once | ORAL | 0 refills | Status: CP
Start: 2021-04-27 — End: ?
  Administered 2021-04-28: 01:00:00 750 mg via ORAL

## 2021-04-27 MED ORDER — KETOROLAC 15 MG/ML IJ SOLN
15 mg | Freq: Once | INTRAVENOUS | 0 refills | Status: CP
Start: 2021-04-27 — End: ?
  Administered 2021-04-28: 01:00:00 15 mg via INTRAVENOUS

## 2021-04-27 MED ORDER — LIDOCAINE 5 % TP PTMD
1 | Freq: Once | TOPICAL | 0 refills | Status: DC
Start: 2021-04-27 — End: 2021-04-28
  Administered 2021-04-28: 03:00:00 1 via TOPICAL

## 2021-04-27 MED ORDER — LIDOCAINE 5 % TP PTMD
1 | MEDICATED_PATCH | TOPICAL | 0 refills | 30.00000 days | Status: AC | PRN
Start: 2021-04-27 — End: ?

## 2021-04-28 NOTE — ED Notes
Pt given print out discharge instructions and vebally acknowledged and understands. Pt given belongings and states no further questions concers at this time.

## 2021-12-08 ENCOUNTER — Encounter: Admit: 2021-12-08 | Discharge: 2021-12-08 | Payer: MEDICARE | Primary: Family

## 2021-12-12 ENCOUNTER — Encounter: Admit: 2021-12-12 | Discharge: 2021-12-12 | Payer: MEDICARE | Primary: Family

## 2021-12-20 ENCOUNTER — Encounter: Admit: 2021-12-20 | Discharge: 2021-12-20 | Payer: MEDICARE | Primary: Family

## 2021-12-20 DIAGNOSIS — M810 Age-related osteoporosis without current pathological fracture: Secondary | ICD-10-CM

## 2021-12-20 DIAGNOSIS — I890 Lymphedema, not elsewhere classified: Secondary | ICD-10-CM

## 2021-12-20 DIAGNOSIS — M545 Chronic low back pain: Secondary | ICD-10-CM

## 2021-12-20 DIAGNOSIS — N401 Enlarged prostate with lower urinary tract symptoms: Secondary | ICD-10-CM

## 2021-12-20 DIAGNOSIS — Z136 Encounter for screening for cardiovascular disorders: Secondary | ICD-10-CM

## 2021-12-20 DIAGNOSIS — N529 Male erectile dysfunction, unspecified: Secondary | ICD-10-CM

## 2021-12-20 DIAGNOSIS — I714 Abdominal aortic aneurysm (AAA) without rupture (HCC): Secondary | ICD-10-CM

## 2021-12-20 DIAGNOSIS — R0609 Other forms of dyspnea: Secondary | ICD-10-CM

## 2021-12-20 DIAGNOSIS — F32A Depression: Secondary | ICD-10-CM

## 2021-12-20 DIAGNOSIS — R002 Palpitations: Secondary | ICD-10-CM

## 2021-12-20 DIAGNOSIS — E785 Hyperlipidemia, unspecified: Secondary | ICD-10-CM

## 2021-12-20 MED ORDER — ROSUVASTATIN 5 MG PO TAB
5 mg | ORAL_TABLET | Freq: Every day | ORAL | 3 refills | 90.00000 days | Status: AC
Start: 2021-12-20 — End: ?

## 2021-12-20 NOTE — Progress Notes
Date of Service: 12/20/2021    Louis Wood is a 66 y.o. male.       HPI     Louis Wood was initially seen last year for further evaluation of an abnormal ECG which showed premature ectopic beats.  He reports that he has also been followed for an abdominal aortic aneurysm which is not yet required treatment.  His greatest problem is chronic back pain and he indicates that he has undergone a number of back surgeries in the past.  He reports that he has been disabled since 1992 before that worked in a [employer].  I noticed that he was seen in the emergency room in January 2023 for abdominal discomfort.  This was thought to be due to colitis.  He has been seen by vascular surgery and currently has a stable abdominal aneurysm that measures approximately 3.3 cm.  He was seen in the emergency room in August 2023 for shortness of breath thought to be due to an exacerbation of COPD and improved with steroids and an inhaler.   He stopped taking his rosuvastatin last year but reports no adverse reactions to this medication.  He thought that our office had instructed him not to take rosuvastatin, but that was not the case.  When I saw him last year I recommended that he increase the dose which he politely declined.  Louis Wood reports that he has not been treated for hypertension or diabetes mellitus.  He has a prior smoking history but quit smoking in 2011.  Otherwise, The patient has been stable and reports no angina, congestive symptoms, palpitations, sensation of sustained forceful heart pounding, lightheadedness or syncope.  His exercise tolerance has been stable, but is limited to walking approximately 100 yards because of chronic back discomfort.  Occasionally, he may experience some dyspnea with more prolonged walking.  He has no other congestive symptoms such as edema, orthopnea or nocturnal dyspnea.. The patient reports no myalgias, bleeding abnormalities, or strokelike symptoms.         Vitals: 12/20/21 1303   BP: 112/72   BP Source: Arm, Left Upper   Pulse: 64   SpO2: 99%   O2 Device: None (Room air)   PainSc: Zero   Weight: 75.8 kg (167 lb)   Height: 188 cm (6' 2)     Body mass index is 21.44 kg/m?Marland Kitchen     Past Medical History  Patient Active Problem List    Diagnosis Date Noted   ? 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 Hyperlipidemia 08/26/2020   ? AAA (abdominal aortic aneurysm) (HCC) 08/26/2020   ? BPH with obstruction/lower urinary tract symptoms    ? 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   HENT: Negative.    Eyes: Negative.    Cardiovascular: Negative.    Respiratory: Negative.    Endocrine: Negative.    Hematologic/Lymphatic: Negative.    Skin: Negative.    Musculoskeletal: Negative.    Gastrointestinal: Negative.    Genitourinary: Negative.    Neurological: Negative.    Psychiatric/Behavioral: Negative.    Allergic/Immunologic: Negative.        Physical Exam  GENERAL: The patient is well developed, well nourished, resting comfortably and in no distress.   HEENT: No abnormalities of the visible oro-nasopharynx, conjunctiva or sclera are noted.  NECK: There is no jugular venous distension. Carotids are palpable and without bruits. There is no thyroid enlargement.  Chest: Lung fields are clear to auscultation. There are no wheezes or crackles.  He is wearing a back brace which she indicates helps quite a bit.  CV: There is a regular rhythm. The first and second heart sounds are normal. There are no murmurs, gallops or rubs.  ABD: The abdomen is soft and supple with normal bowel sounds. There is no hepatosplenomegaly, ascites, tenderness, masses or bruits.  Neuro: There are no focal motor defects. Ambulation is normal. Cognitive function appears normal.  Ext: There is no edema or evidence of deep vein thrombosis. Peripheral pulses are satisfactory.    SKIN: There are no rashes and no cellulitis  PSYCH: The patient is calm, rationale and oriented.  ?  Cardiovascular Studies  A twelve-lead ECG obtained on 12/20/2021 reveals normal sinus rhythm with a heart rate of 63 bpm.  There is no evidence of myocardial ischemia or infarction.  CT abdomen 04/27/2021:  FINDINGS:     Calcified left left hilar and left lower lobe granulomas. Minimal left   basilar linear atelectasis or scarring. Mild basilar emphysema. Heart size   is normal. Coronary artery calcifications.     Liver is normal in size. Small hepatic cysts and additional tiny hepatic   hypodensities which are too small to characterize. The major portal veins   are patent. The gallbladder is nondilated. No radiopaque gallstones or   biliary ductal dilatation. The spleen is normal in size. Mildly atrophic   pancreas without significant ductal dilatation. The adrenal glands are   unremarkable. Left renal vascular calcification. No hydronephrosis.     Bowel loops are normal in caliber. Stable small hypodensity along the   posterior aspect of the proximal V3 segment which could represent a small   diverticulum or duplication cyst (series 2 image 79). Normal appendix.   Mild pancolonic diverticulosis without evidence of diverticulitis. No   ascites, lymphadenopathy, or pneumoperitoneum. Stable 3.3 cm infrarenal   abdominal aortic aneurysm and bilateral common iliac artery aneurysms.   Moderate calcified aortoiliac atherosclerosis with similar eccentric soft   plaque and mural thrombus in the aneurysm sacs.     Prostate is mildly enlarged with dystrophic calcifications. Partially   filled urinary bladder is grossly unremarkable. 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 Diffuse   osteopenia. Thoracolumbar spondylosis. Mild bilateral hip arthrosis. No   destructive osseous lesions are identified.     IMPRESSION   1. ?Mild colonic diverticulosis without evidence of diverticulitis. No   inflammatory mass, ascites, or bowel obstruction. Normal appendix.   2. ?Stable infrarenal abdominal aortic and bilateral common iliac artery   aneurysms.     Echo Doppler 09/01/2020:  Interpretation Summary    1. Normal left ventricular size and systolic function. Estimated EF ~60%.  No segmental wall motion abnormalities.  Normal diastolic function.  2. Normal right ventricular size and systolic function.  3. Normal left and right atrial sizes.  4. Mild mitral valve regurgitation.  5. No significant pericardial effusion.  6. Unable to estimate pulmonary artery systolic pressure due to inadequate tricuspid regurgitation signal.  7. Normal central venous pressure.  ?  No prior studies available for comparison.   ?  ?  ?  Echocardiographic Findings    Left Ventricle The left ventricular size is normal. The left ventricular wall thickness is normal. Normal geometry. The left ventricular systolic function is normal. There are no segmental wall motion abnormalities. Normal left ventricular diastolic function.   Right Ventricle The right  ventricular size, wall thickness and systolic function are normal. The pulmonary artery pressure could not be estimated due to inadequate tricuspid regurgitation signal.   Left Atrium Normal size.   Right Atrium Normal size.   IVC/SVC Normal central venous pressure (0-5 mm Hg).   Mitral Valve Normal valve structure. No stenosis. Mild regurgitation.   Tricuspid Valve Normal valve structure. No stenosis. No regurgitation.   Aortic Valve Fibrocalcific changes of the aortic valve leaflets. No stenosis. No regurgitation.   Pulmonary The pulmonic valve was not well seen. No stenosis. No regurgitation.   Aorta The aortic root and the visualized portions of the proximal ascending aorta are normal in size in relation to body surface area.   Pericardium No pericardial effusion.   ?     Cardiovascular Health Factors  Vitals BP Readings from Last 3 Encounters:   12/20/21 112/72   04/27/21 (!) 178/96   04/12/21 129/89     Wt Readings from Last 3 Encounters:   12/20/21 75.8 kg (167 lb)   04/27/21 74.8 kg (165 lb)   04/12/21 72.7 kg (160 lb 3.2 oz)     BMI Readings from Last 3 Encounters:   12/20/21 21.44 kg/m?   04/27/21 21.18 kg/m?   04/12/21 20.57 kg/m?      Smoking Social History     Tobacco Use   Smoking Status Former   ? Types: Cigarettes   ? Quit date: 08/26/2009   ? Years since quitting: 12.3   Smokeless Tobacco Never   Tobacco Comments    quit smoking 7 years ago      Lipid Profile Cholesterol   Date Value Ref Range Status   02/08/2021 174  Final     HDL   Date Value Ref Range Status   02/08/2021 46  Final     LDL   Date Value Ref Range Status   02/08/2021 112 (H) <100 Final     Triglycerides   Date Value Ref Range Status   02/08/2021 83  Final      Blood Sugar No results found for: HGBA1C  Glucose   Date Value Ref Range Status   04/27/2021 100 70 - 100 MG/DL Final   16/12/9602 91 70 - 100 MG/DL Final   54/11/8117 88  Final          Problems Addressed Today  Encounter Diagnoses   Name Primary?   ? Screening for heart disease Yes   ? Abdominal aortic aneurysm (AAA) without rupture (HCC)    ? DOE (dyspnea on exertion)    ? Palpitation        Assessment and Plan   Louis Wood reports that he is currently stable from a cardiovascular perspective.  He reports no angina or congestive symptoms and his blood pressure appears well controlled.  Alternatives for the treatment of hyperlipidemia were reviewed with the patient and he was agreeable to restarting rosuvastatin 5 mg daily.  I have asked him to return for follow-up in 1 years time to review his progress.  Mild aerobic exercise and diet were discussed.  The total time spent during this interview and exam with preparation and chart review was 30 minutes.         Current Medications (including today's revisions)  ? aspirin EC (ASPIR-LOW) 81 mg tablet Take one tablet by mouth daily. Take with food.   ? lidocaine (LIDODERM) 5 % topical patch Apply one patch topically to affected area every 24 hours as needed for Pain. Apply patch  for 12 hours, then remove for 12 hours before repeating.   ? methadone (METHADOSE) 10 mg tablet Take one tablet by mouth every 8 hours.   ? methocarbamoL (ROBAXIN) 750 mg tablet Take one tablet by mouth three times daily as needed for Spasms.   ? nystatin (MYCOSTATIN) 100,000 units/mL oral suspension    ? other medication one Dose twice daily. Super Beta Prostate   ? Pantoprazole (PROTONIX) 40 mg granules Take forty mg by mouth daily.   ? rosuvastatin (CRESTOR) 5 mg tablet Take one tablet by mouth daily.

## 2022-03-24 ENCOUNTER — Encounter: Admit: 2022-03-24 | Discharge: 2022-03-24 | Payer: MEDICARE | Primary: Family

## 2022-05-05 ENCOUNTER — Encounter: Admit: 2022-05-05 | Discharge: 2022-05-05 | Payer: MEDICARE | Primary: Family

## 2022-05-05 DIAGNOSIS — J439 Emphysema, unspecified: Secondary | ICD-10-CM

## 2022-05-10 ENCOUNTER — Encounter: Admit: 2022-05-10 | Discharge: 2022-05-10 | Payer: MEDICARE | Primary: Family

## 2022-06-26 ENCOUNTER — Encounter: Admit: 2022-06-26 | Discharge: 2022-06-26 | Payer: MEDICARE | Primary: Family

## 2023-03-13 ENCOUNTER — Encounter: Admit: 2023-03-13 | Discharge: 2023-03-13 | Payer: MEDICARE | Primary: Family

## 2023-04-13 IMAGING — US DUPABC
2 series · 14 of 16 positions shown · non-contrast
Comparison: none

[Series 1: us duplex aorta,ivc,iliac, com · 1 of 1 slices shown (1 of 2)]
[im 1/1]
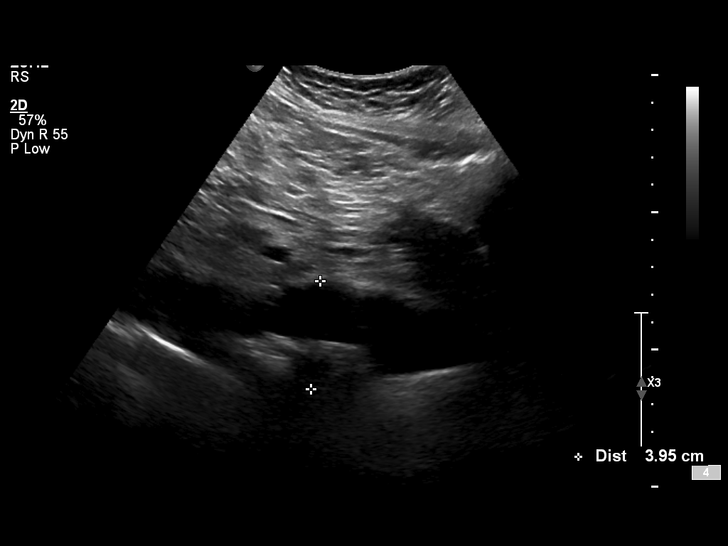

[Series 1: us duplex aorta,ivc,iliac, com · 13 of 28 slices shown (2 of 2)]
[im 1/28]
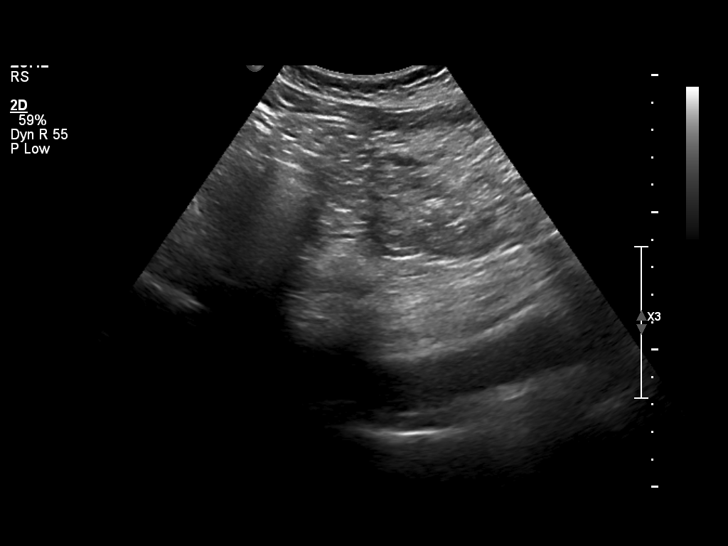
[im 2/28]
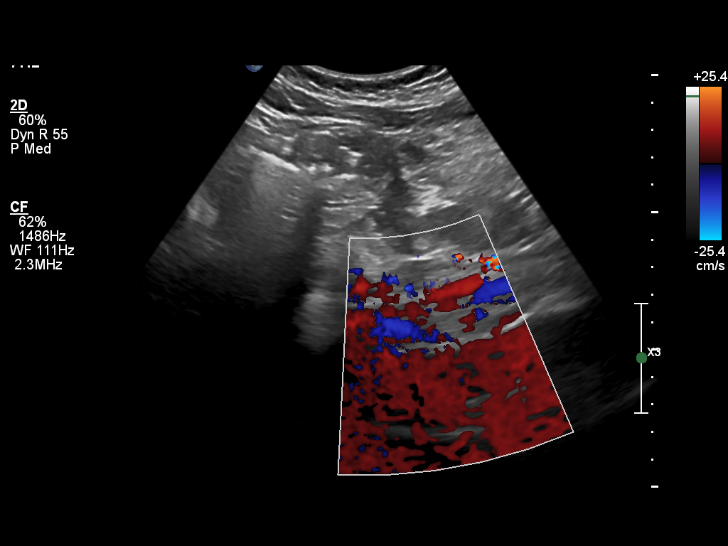
[im 6/28]
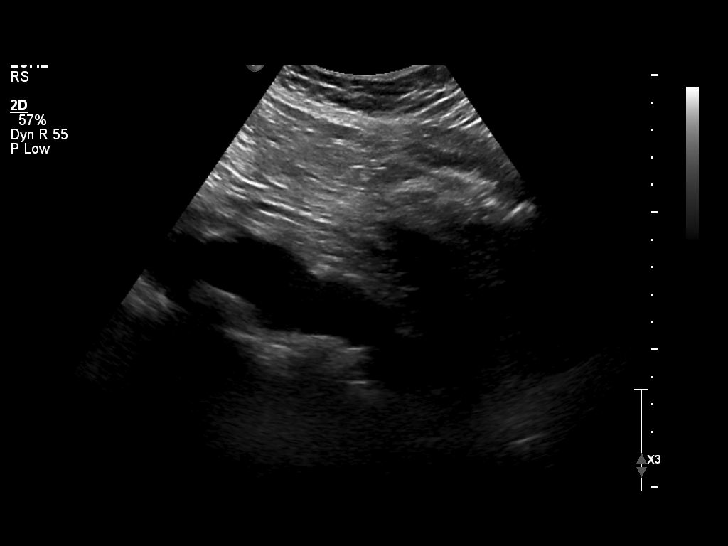
[im 8/28]
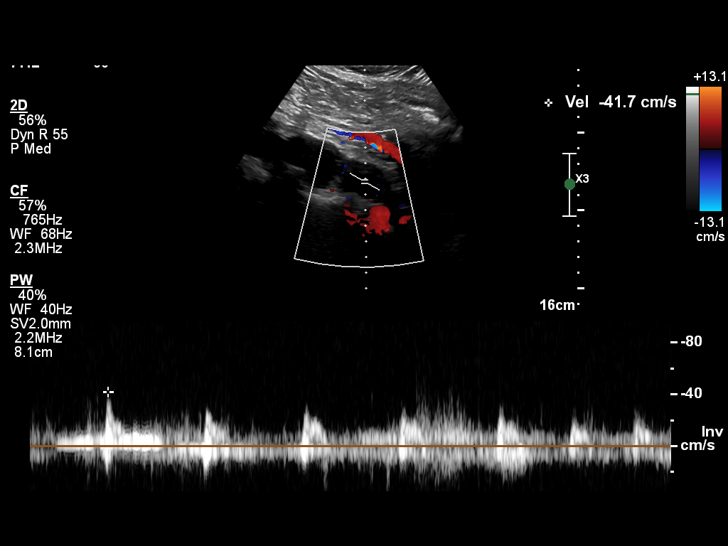
[im 10/28]
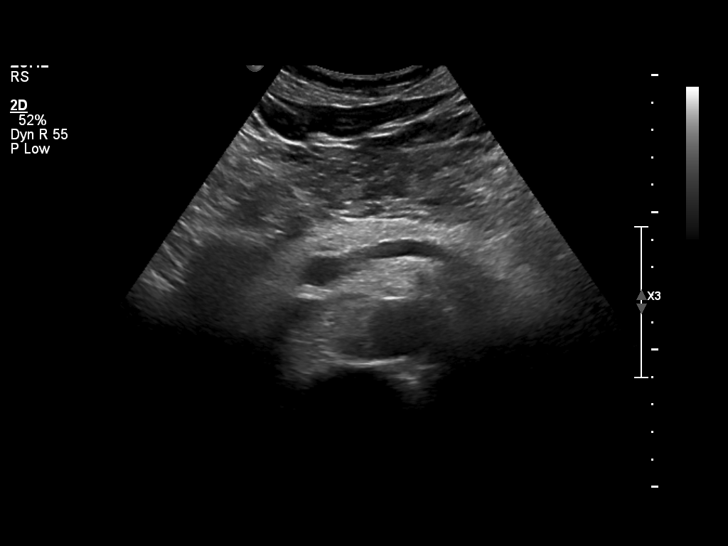
[im 12/28]
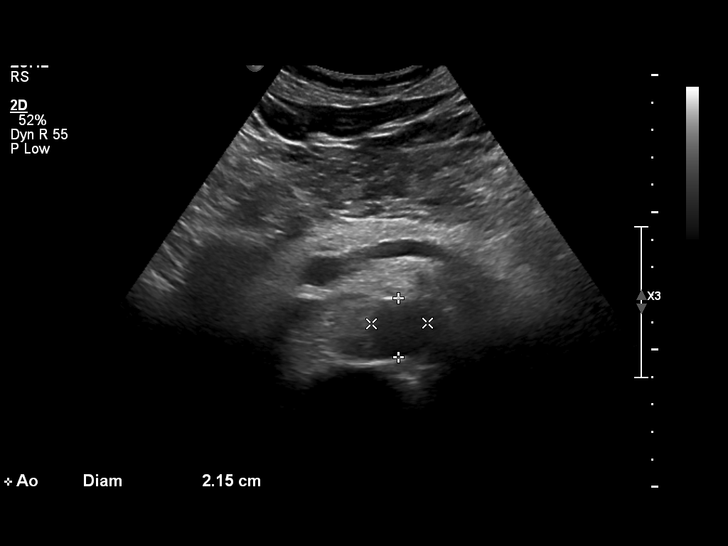
[im 14/28]
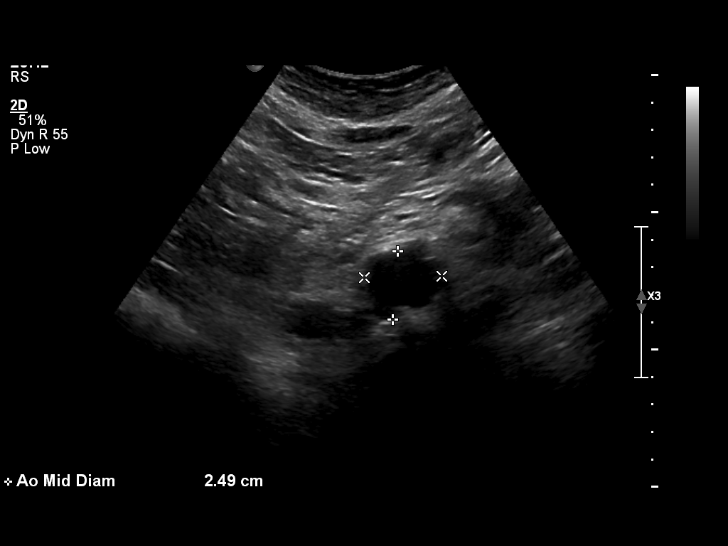
[im 16/28]
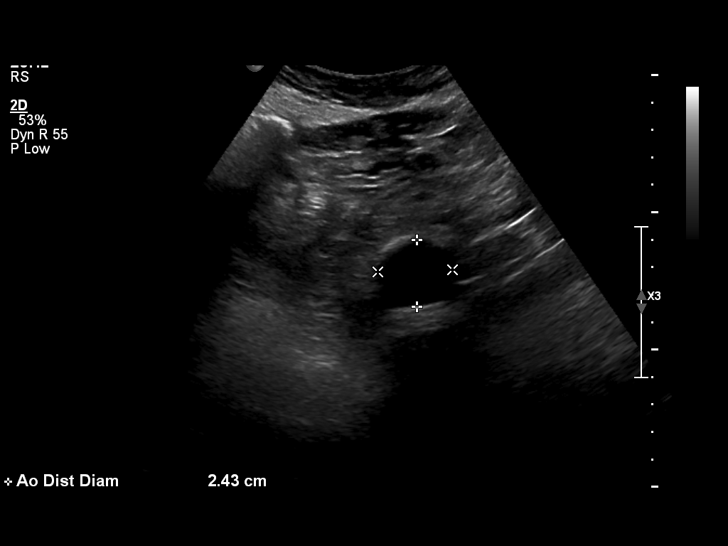
[im 18/28]
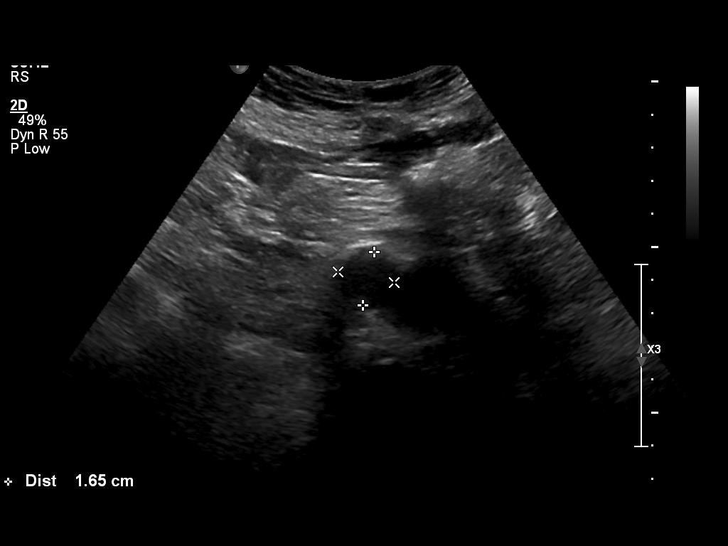
[im 22/28]
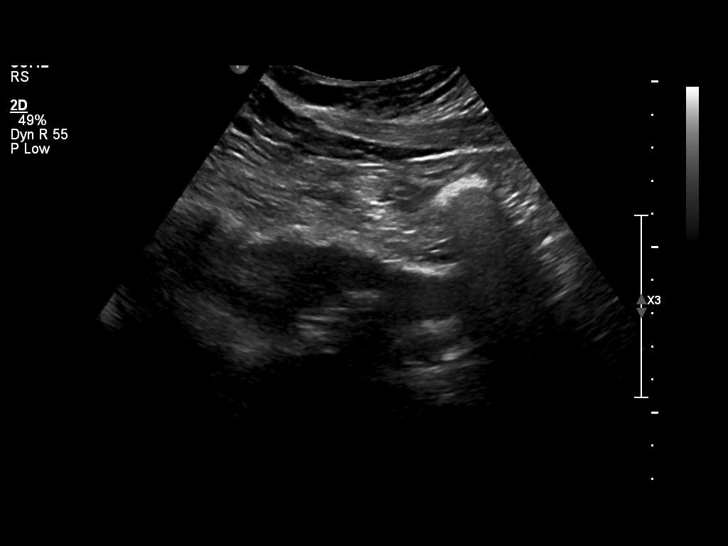
[im 24/28]
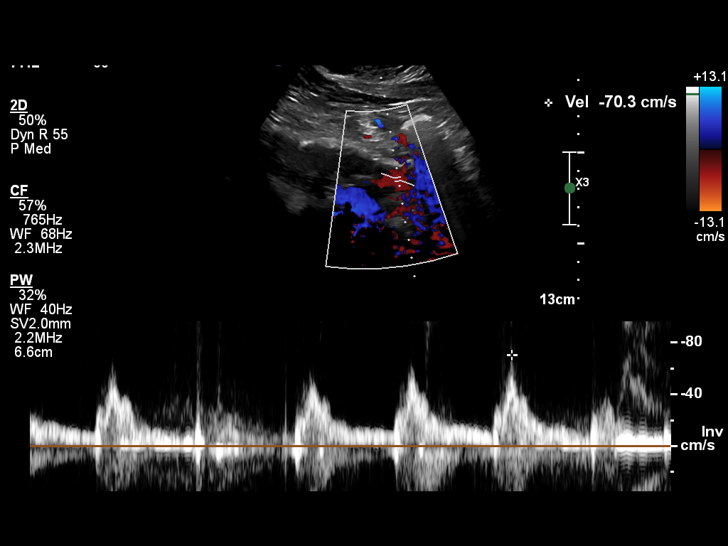
[im 26/28]
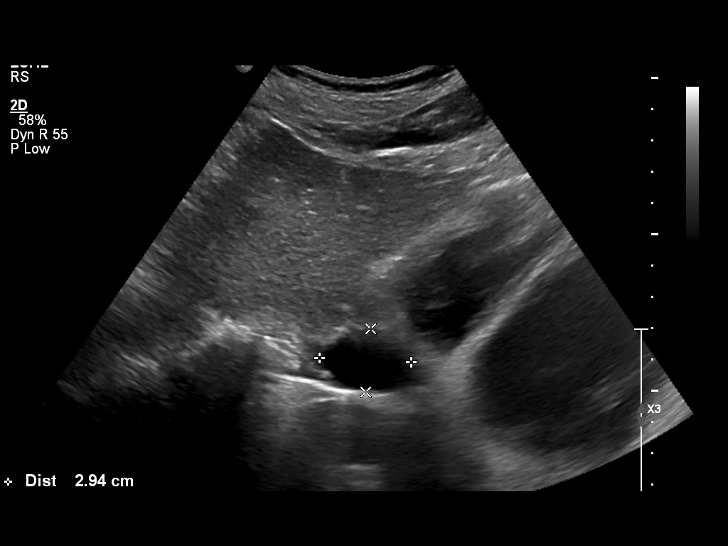
[im 28/28]
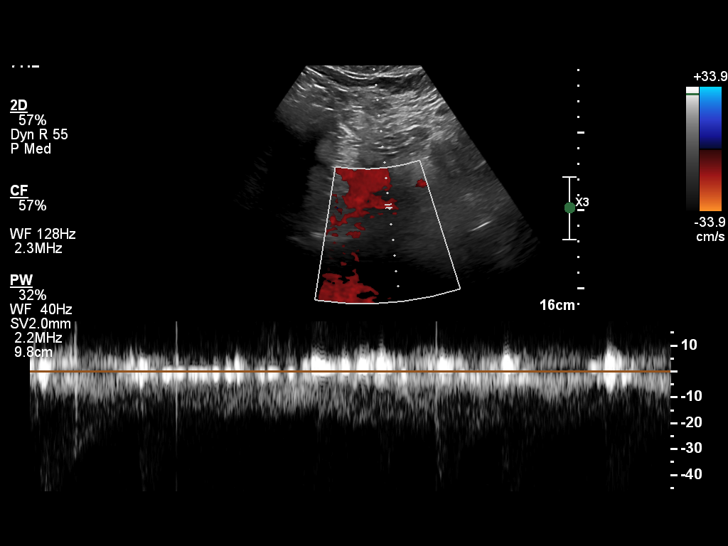

[14 of 16 positions shown; findings below may reference images not displayed]

EXAM

Ultrasound aorta

INDICATION

recheck AAA
recheck for AAA; hx of smoking x40 years

FINDINGS

The prior study was reviewed from [DATE].

Ultrasound of the abdominal aorta demonstrates a diameter of 2.2 x 2.1 cm in AP and transverse
planes proximally, 2.5 x 2.8 cm in AP tract in transverse diameter in the midportion and 2.4 x
cm in AP and transverse diameter distally.

The right common iliac artery is measured at 1.7 x 1.7 cm in diameter. The left common iliac artery
is measured at 2.7 x 2.1 cm in diameter.

IMPRESSION

The abdominal aortic diameter is within the upper of.

There are left and right common iliac artery aneurysms, most notably on the left which measures
x 2.1 cm in diameter.

Tech Notes:

recheck for AAA; hx of smoking x40 years

## 2023-09-08 IMAGING — CR SPLUMBLM
3 series · 3 of 3 positions shown · non-contrast
Comparison: none

[lspine ap]
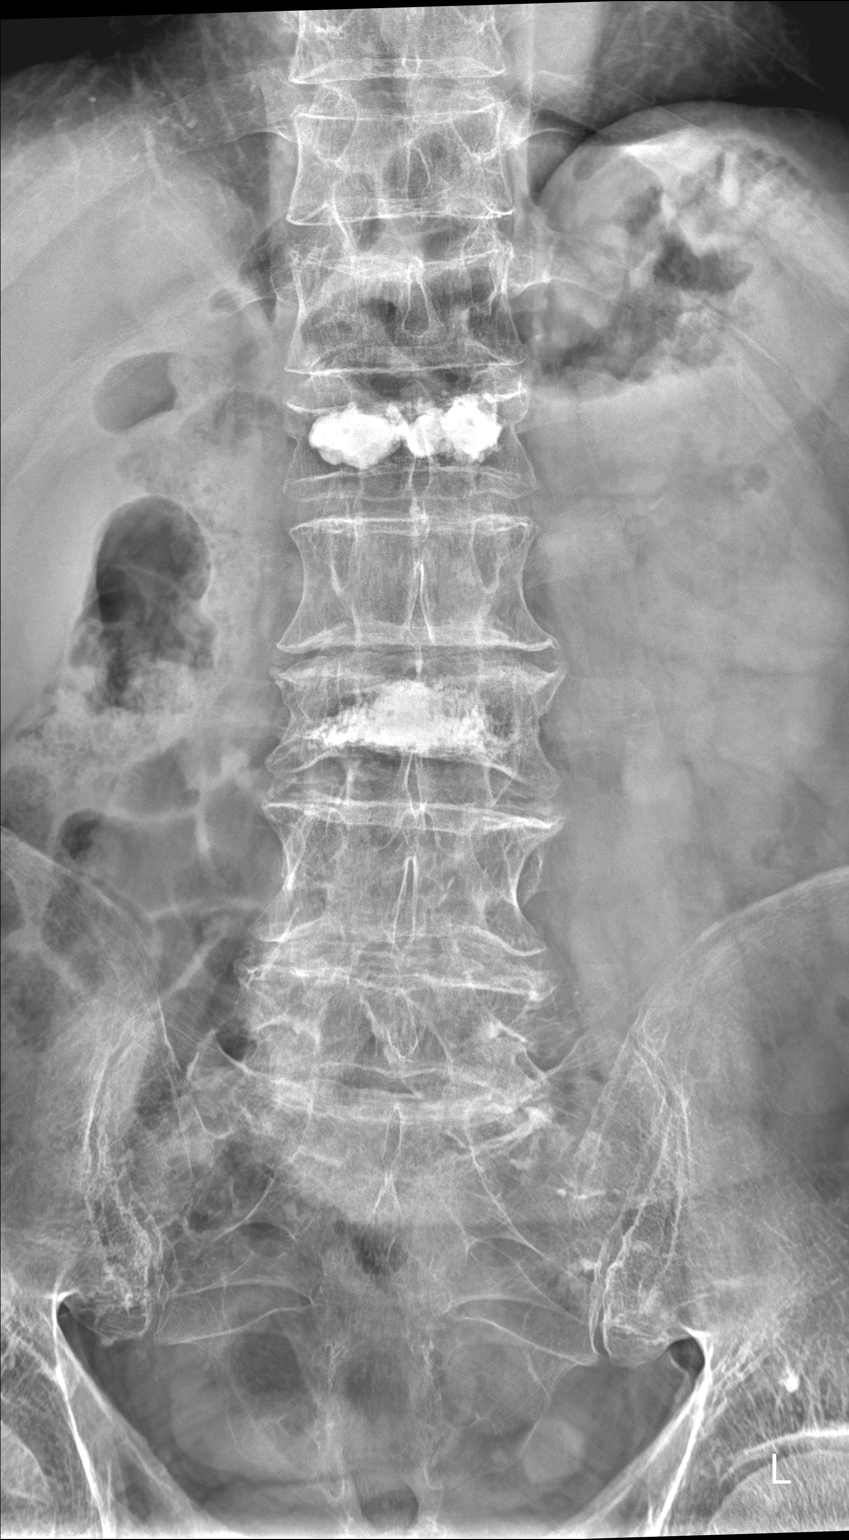

[lspine lat]
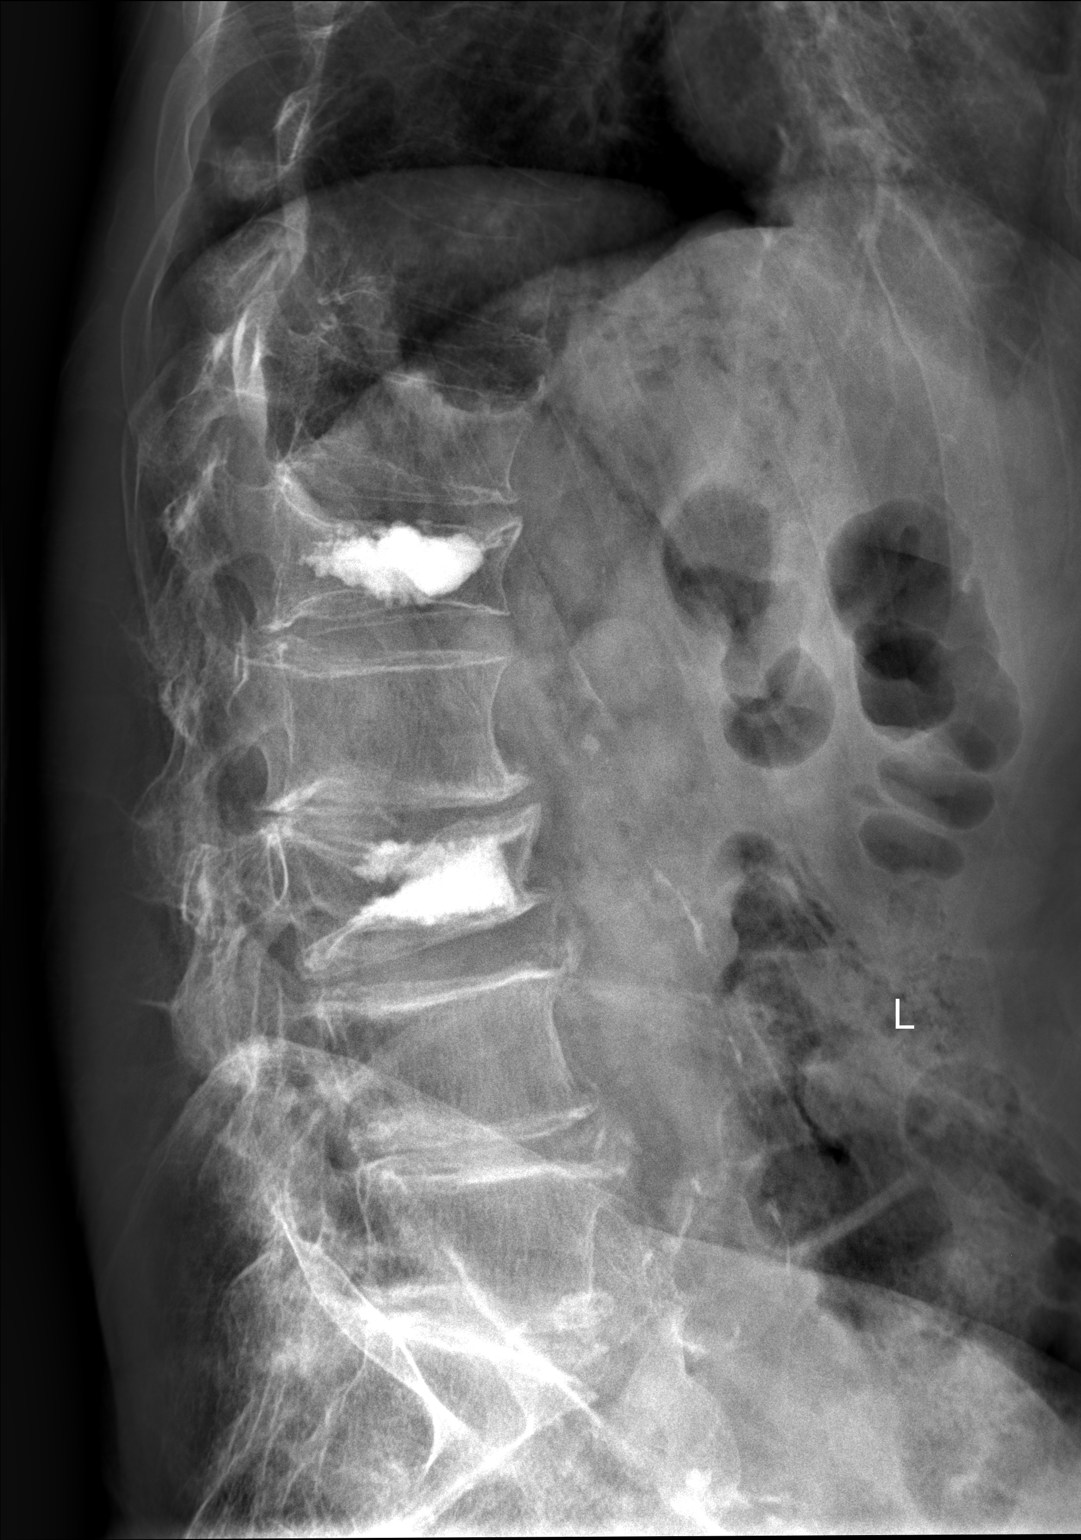

[lspine l5-s1]
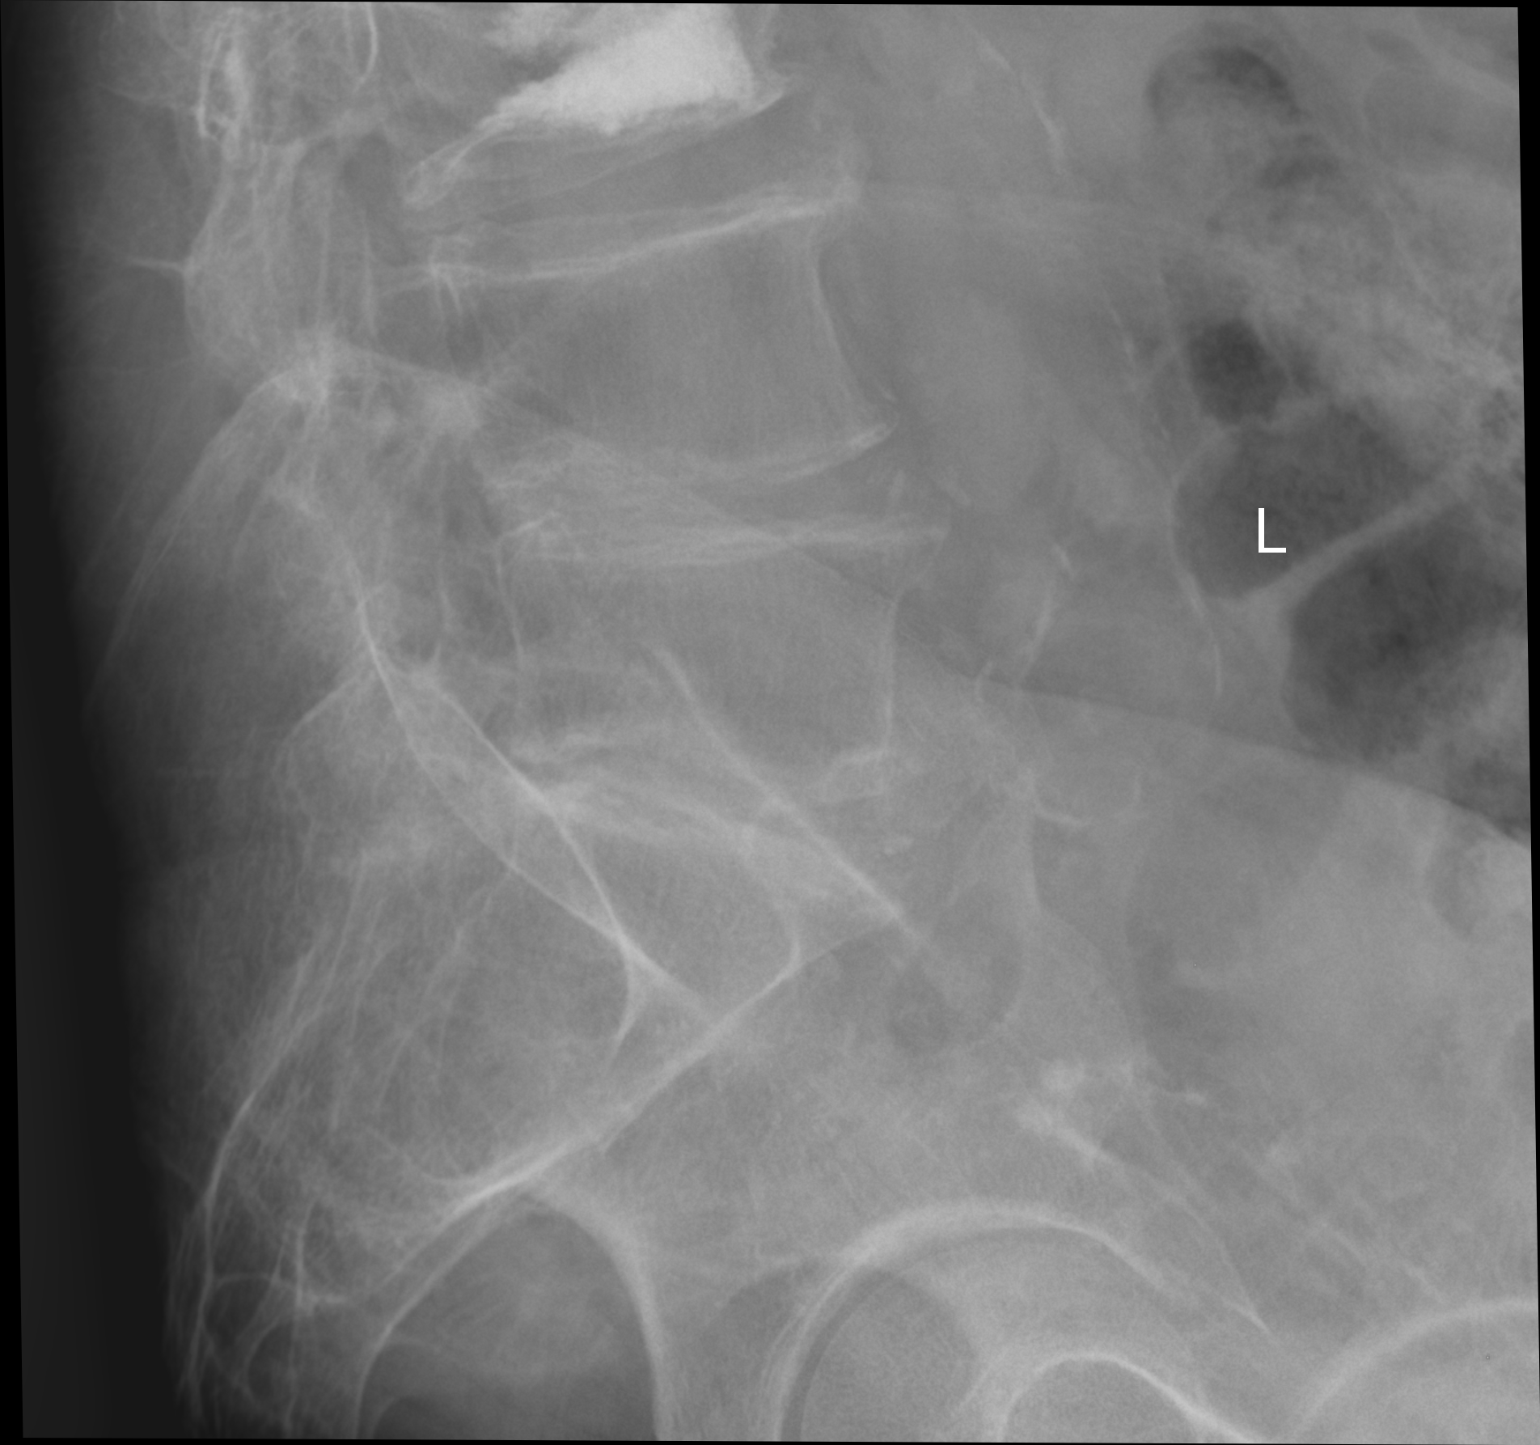

[3 of 3 positions shown; findings below may reference images not displayed]

DIAGNOSTIC STUDIES

EXAM

XR lumbar spine 2-3V

INDICATION

worsening lower back pain
INJURY FEW WEEKS AGO. NEW INCREASING PAIN. HX OF INJURY AND SURGERY TO BACK. PREV FRAC OF L2-L3

TECHNIQUE

AP lateral and spot films

COMPARISONS

May 23, 2018

FINDINGS

Prior vertebroplasty at L1 and L3 is unchanged from prior exam. No new compression fractures are
seen. The disc space narrowing and osteophytic spurring throughout the lumbar spine and associated
facet hypertrophy.

IMPRESSION

Prior vertebroplasty is unchanged at L1 and L3. No new compression fractures are seen.

Diffuse spondylitic changes.

Tech Notes:

INJURY FEW WEEKS AGO. NEW INCREASING PAIN. HX OF INJURY AND SURGERY TO BACK. PREV FRAC OF L2-L3

## 2024-01-22 ENCOUNTER — Encounter: Admit: 2024-01-22 | Discharge: 2024-01-22 | Payer: MEDICARE | Primary: Family

## 2024-03-27 ENCOUNTER — Encounter: Admit: 2024-03-27 | Discharge: 2024-03-27 | Payer: MEDICARE | Primary: Family

## 2024-03-27 NOTE — Telephone Encounter [36]
 Returned call to Manuelita, patient daughter. It appears patient was just seen by another vascular provider's office yesterday 03/26/24. CT imaging was obtained, patient AAA measuring 3.6 cm per notes, with follow up recommended in 6 months by Vascular Surgery in [place], [place]. Patient daughter would like to follow up with their group. Will cancel upcoming appointment 04/11/24. Informed Manuelita that we would be more than happy to follow up with patient in the future if they wish to be seen by [facility].
